# Patient Record
Sex: Male | Born: 1955 | Race: White | Hispanic: No | Marital: Married | Smoking: Never smoker
Health system: Southern US, Community
[De-identification: ages and names within clinical notes are randomized; demographics above are authoritative.]

## PROBLEM LIST (undated history)

## (undated) DIAGNOSIS — M1990 Unspecified osteoarthritis, unspecified site: Secondary | ICD-10-CM

## (undated) DIAGNOSIS — E7800 Pure hypercholesterolemia, unspecified: Secondary | ICD-10-CM

## (undated) DIAGNOSIS — F419 Anxiety disorder, unspecified: Secondary | ICD-10-CM

## (undated) DIAGNOSIS — K219 Gastro-esophageal reflux disease without esophagitis: Secondary | ICD-10-CM

---

## 2008-10-18 ENCOUNTER — Encounter: Admission: RE | Admit: 2008-10-18 | Discharge: 2008-10-18 | Payer: Self-pay | Admitting: Family Medicine

## 2008-10-18 IMAGING — US US EXTREM LOW VENOUS*L*
1 series · 14 of 24 positions shown · non-contrast
Comparison: None

CLINICAL DATA: Pain



[Series 1: us extrem low venous*left* · 14 of 31 slices shown]
[im 1/31]
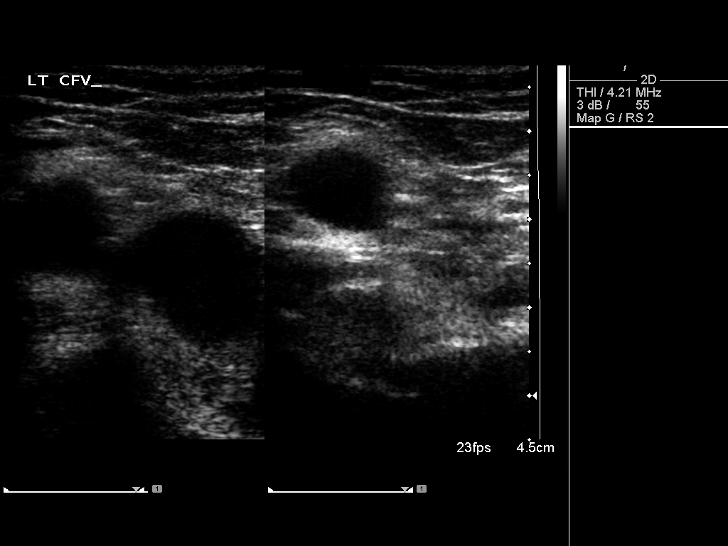
[im 3/31]
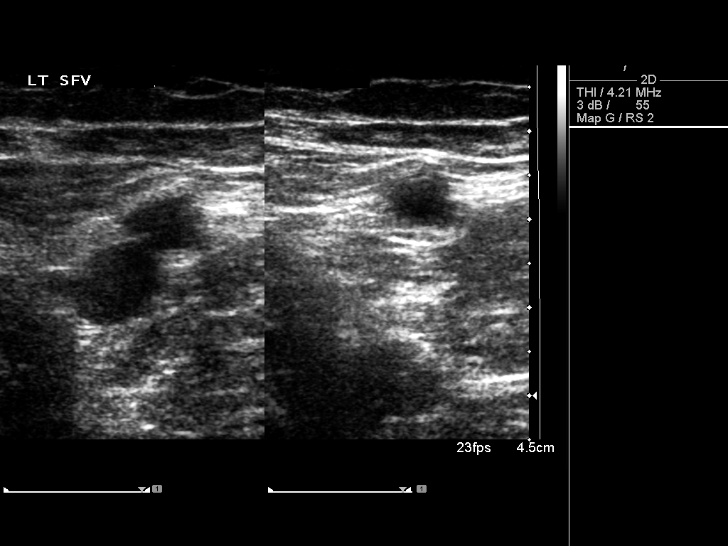
[im 6/31]
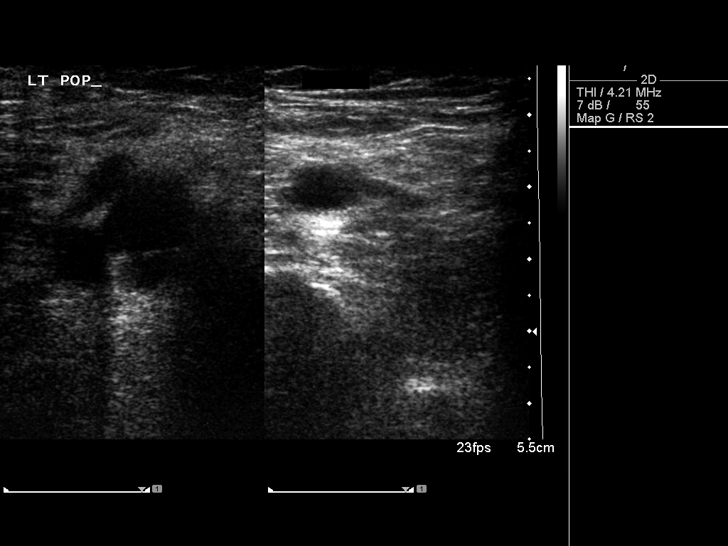
[im 8/31]
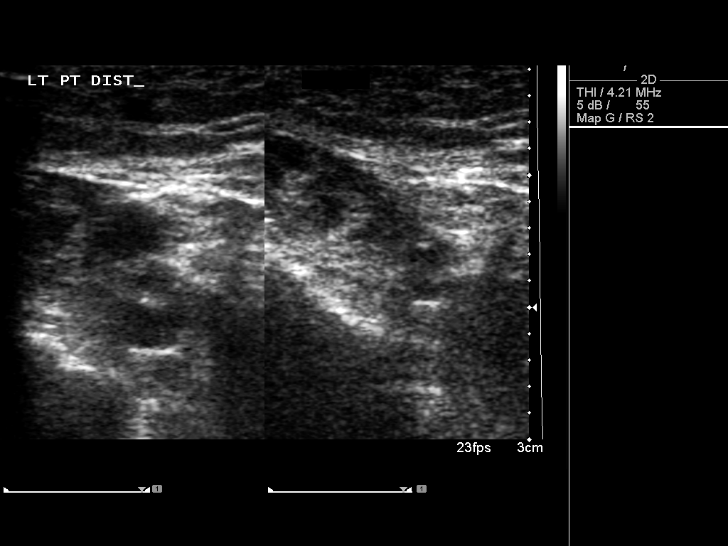
[im 10/31]
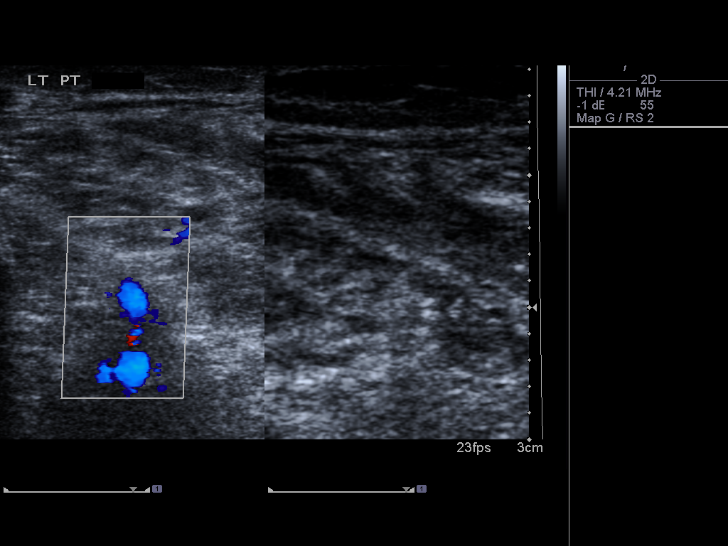
[im 12/31]
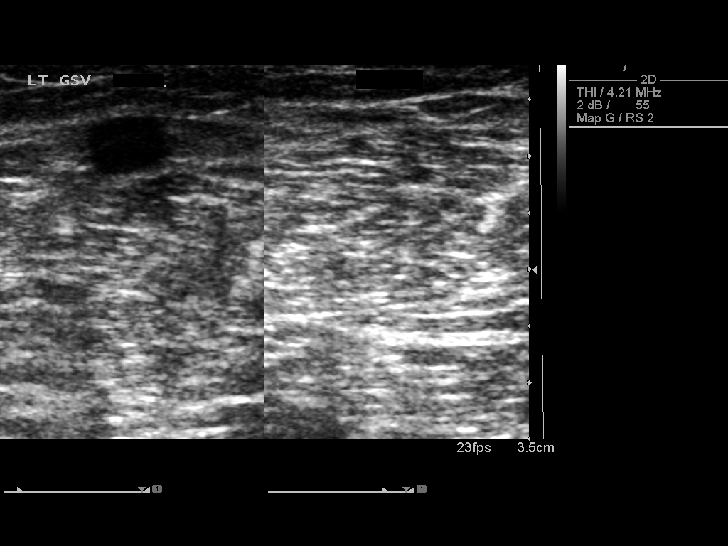
[im 15/31]
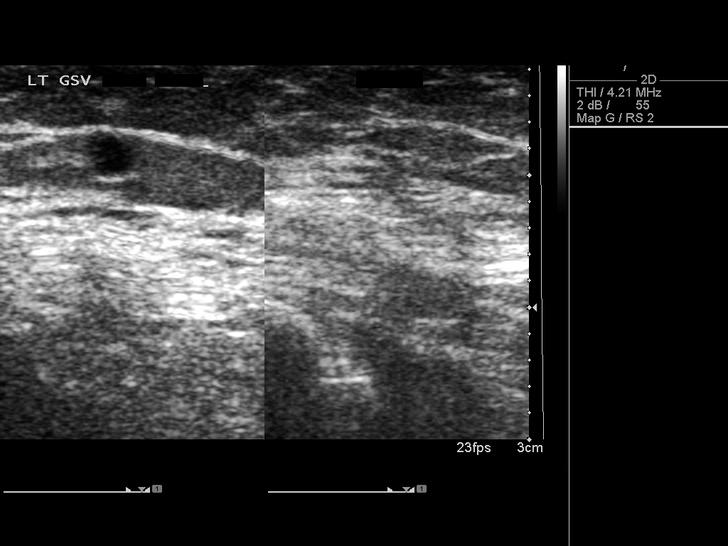
[im 16/31]
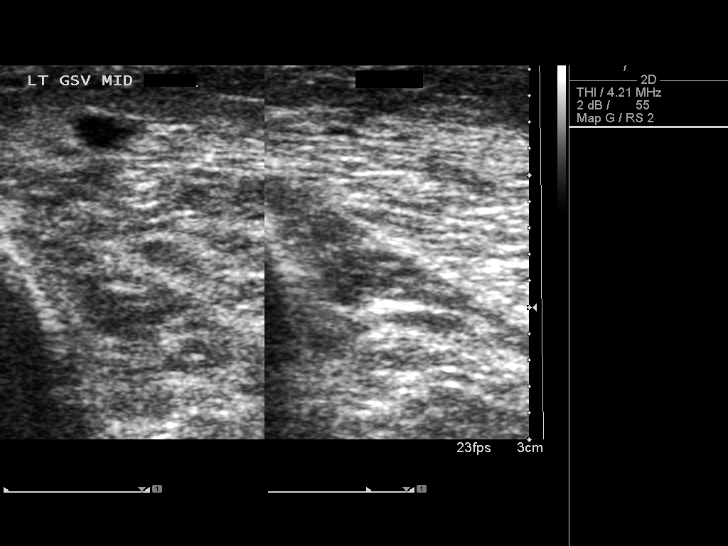
[im 19/31]
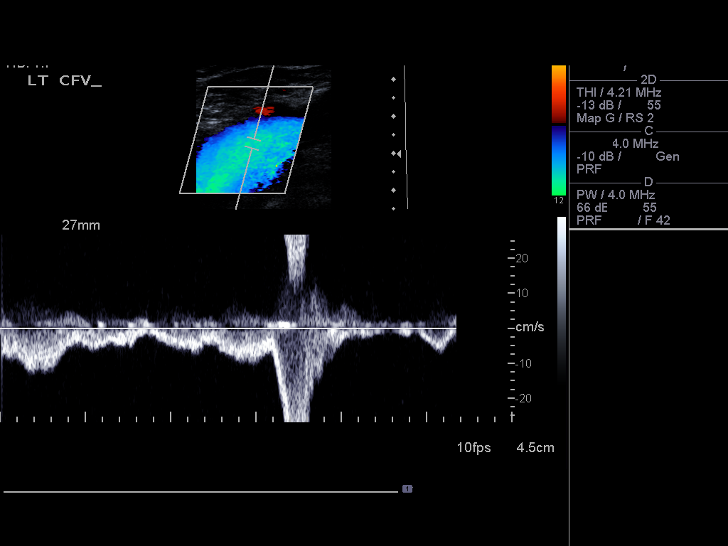
[im 21/31]
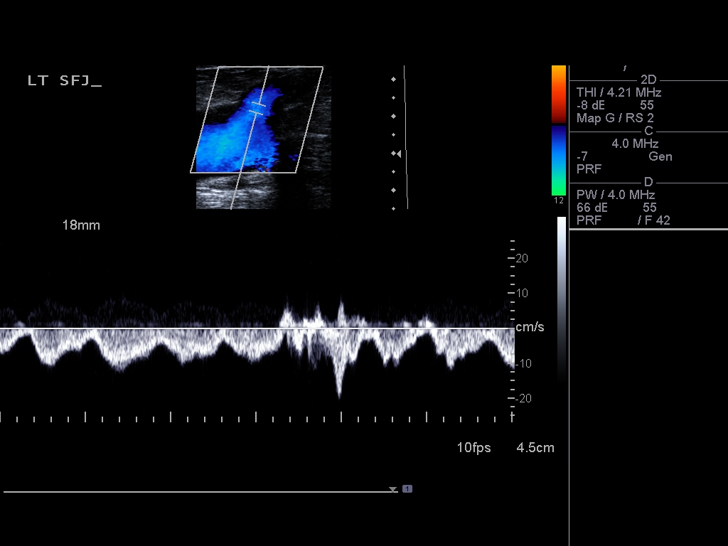
[im 24/31]
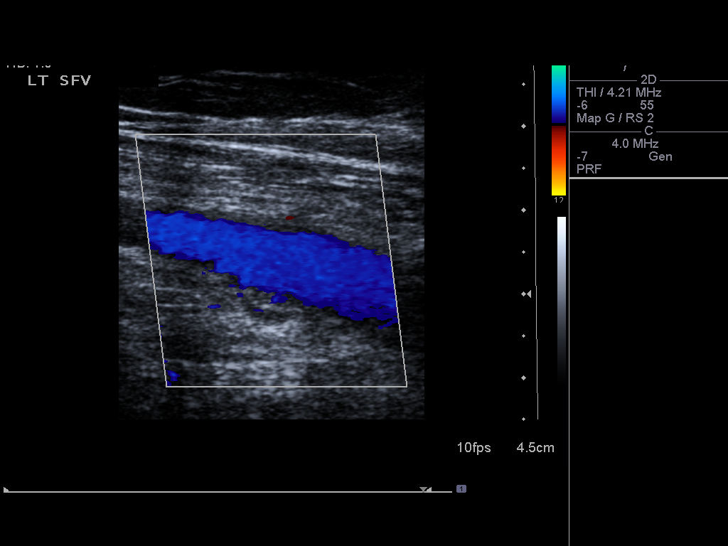
[im 25/31]
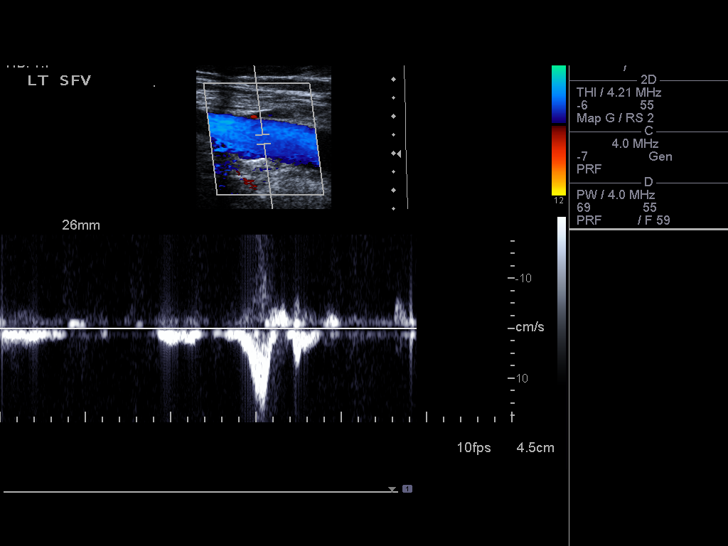
[im 28/31]
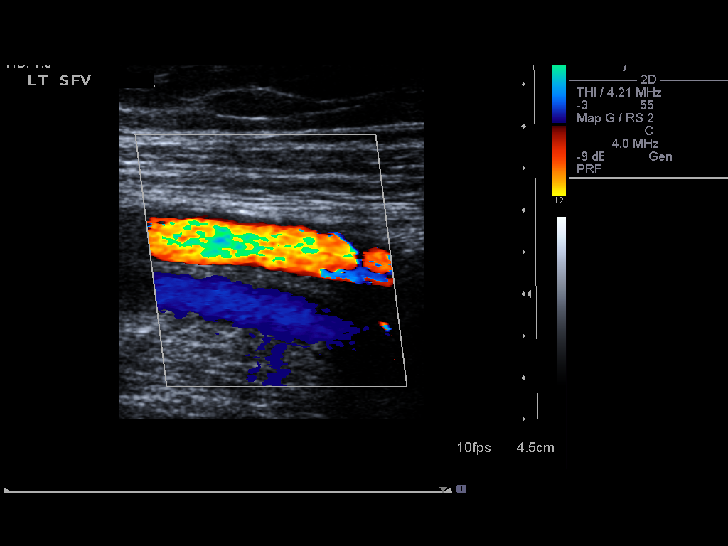
[im 31/31]
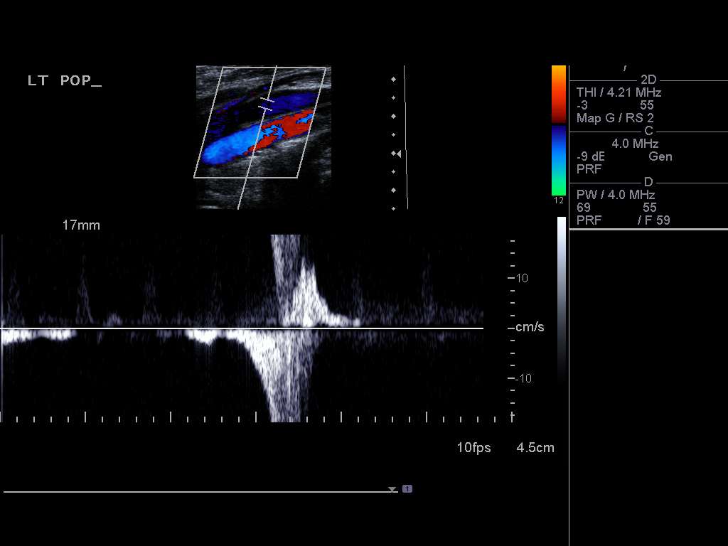

[14 of 24 positions shown; findings below may reference images not displayed]

FINDINGS: Please compressibility of the left common femoral,
femoral, and popliteal veins.  Doppler analysis demonstrates
respiratory phasicity and augmentation of flow upon calf
compression.
IMPRESSION: No evidence of DVT in the left lower extremity

## 2015-04-25 DIAGNOSIS — E7800 Pure hypercholesterolemia, unspecified: Secondary | ICD-10-CM | POA: Diagnosis present

## 2015-04-25 DIAGNOSIS — I8390 Asymptomatic varicose veins of unspecified lower extremity: Secondary | ICD-10-CM | POA: Insufficient documentation

## 2015-04-25 DIAGNOSIS — F411 Generalized anxiety disorder: Secondary | ICD-10-CM | POA: Diagnosis present

## 2015-04-26 DIAGNOSIS — M7712 Lateral epicondylitis, left elbow: Secondary | ICD-10-CM | POA: Insufficient documentation

## 2015-04-26 DIAGNOSIS — M1711 Unilateral primary osteoarthritis, right knee: Secondary | ICD-10-CM | POA: Insufficient documentation

## 2019-04-19 ENCOUNTER — Ambulatory Visit: Payer: Self-pay

## 2019-12-02 DIAGNOSIS — S46219A Strain of muscle, fascia and tendon of other parts of biceps, unspecified arm, initial encounter: Secondary | ICD-10-CM | POA: Insufficient documentation

## 2021-04-25 ENCOUNTER — Encounter (HOSPITAL_COMMUNITY): Payer: Self-pay | Admitting: *Deleted

## 2021-04-25 ENCOUNTER — Emergency Department (HOSPITAL_COMMUNITY)
Admission: EM | Admit: 2021-04-25 | Discharge: 2021-04-25 | Disposition: A | Discharge status: Home / Self Care | Payer: Medicare Other | Attending: Emergency Medicine | Admitting: Emergency Medicine

## 2021-04-25 ENCOUNTER — Emergency Department (HOSPITAL_COMMUNITY): Payer: Medicare Other

## 2021-04-25 ENCOUNTER — Other Ambulatory Visit: Payer: Self-pay

## 2021-04-25 DIAGNOSIS — Z7982 Long term (current) use of aspirin: Secondary | ICD-10-CM | POA: Diagnosis not present

## 2021-04-25 DIAGNOSIS — K862 Cyst of pancreas: Secondary | ICD-10-CM

## 2021-04-25 DIAGNOSIS — R42 Dizziness and giddiness: Secondary | ICD-10-CM

## 2021-04-25 DIAGNOSIS — Z79890 Hormone replacement therapy (postmenopausal): Secondary | ICD-10-CM | POA: Diagnosis not present

## 2021-04-25 DIAGNOSIS — R519 Headache, unspecified: Secondary | ICD-10-CM | POA: Diagnosis not present

## 2021-04-25 DIAGNOSIS — K869 Disease of pancreas, unspecified: Secondary | ICD-10-CM | POA: Insufficient documentation

## 2021-04-25 DIAGNOSIS — E785 Hyperlipidemia, unspecified: Secondary | ICD-10-CM | POA: Insufficient documentation

## 2021-04-25 DIAGNOSIS — Z20822 Contact with and (suspected) exposure to covid-19: Secondary | ICD-10-CM | POA: Insufficient documentation

## 2021-04-25 DIAGNOSIS — R079 Chest pain, unspecified: Secondary | ICD-10-CM | POA: Diagnosis not present

## 2021-04-25 DIAGNOSIS — R1013 Epigastric pain: Secondary | ICD-10-CM | POA: Diagnosis not present

## 2021-04-25 DIAGNOSIS — I69319 Unspecified symptoms and signs involving cognitive functions following cerebral infarction: Secondary | ICD-10-CM | POA: Insufficient documentation

## 2021-04-25 DIAGNOSIS — Z79899 Other long term (current) drug therapy: Secondary | ICD-10-CM | POA: Insufficient documentation

## 2021-04-25 HISTORY — DX: Pure hypercholesterolemia, unspecified: E78.00

## 2021-04-25 HISTORY — DX: Anxiety disorder, unspecified: F41.9

## 2021-04-25 LAB — COMPREHENSIVE METABOLIC PANEL
ALT: 15 U/L (ref 0–44)
AST: 20 U/L (ref 15–41)
Albumin: 3.6 g/dL (ref 3.5–5.0)
Alkaline Phosphatase: 46 U/L (ref 38–126)
Anion gap: 9 (ref 5–15)
BUN: 17 mg/dL (ref 8–23)
CO2: 26 mmol/L (ref 22–32)
Calcium: 8.8 mg/dL — ABNORMAL LOW (ref 8.9–10.3)
Chloride: 105 mmol/L (ref 98–111)
Creatinine, Ser: 0.96 mg/dL (ref 0.61–1.24)
GFR, Estimated: >60 mL/min (ref >60)
Glucose, Bld: 119 mg/dL — ABNORMAL HIGH (ref 70–99)
Potassium: 3.9 mmol/L (ref 3.5–5.1)
Sodium: 140 mmol/L (ref 135–145)
Total Bilirubin: 0.7 mg/dL (ref 0.3–1.2)
Total Protein: 6.2 g/dL — ABNORMAL LOW (ref 6.5–8.1)

## 2021-04-25 LAB — URINALYSIS, ROUTINE W REFLEX MICROSCOPIC
Bilirubin Urine: NEGATIVE
Glucose, UA: NEGATIVE mg/dL
Hgb urine dipstick: NEGATIVE
Ketones, ur: NEGATIVE mg/dL
Nitrite: NEGATIVE
Protein, ur: NEGATIVE mg/dL
Specific Gravity, Urine: 1.02 (ref 1.005–1.030)
pH: 5 (ref 5.0–8.0)

## 2021-04-25 LAB — PROTIME-INR
INR: 1 (ref 0.8–1.2)
Prothrombin Time: 13.4 seconds (ref 11.4–15.2)

## 2021-04-25 LAB — DIFFERENTIAL
Abs Immature Granulocytes: 0.02 10*3/uL (ref 0.00–0.07)
Basophils Absolute: 0 10*3/uL (ref 0.0–0.1)
Basophils Relative: 1 %
Eosinophils Absolute: 0 10*3/uL (ref 0.0–0.5)
Eosinophils Relative: 1 %
Immature Granulocytes: 0 %
Lymphocytes Relative: 9 %
Lymphs Abs: 0.6 10*3/uL — ABNORMAL LOW (ref 0.7–4.0)
Monocytes Absolute: 0.4 10*3/uL (ref 0.1–1.0)
Monocytes Relative: 7 %
Neutro Abs: 5.2 10*3/uL (ref 1.7–7.7)
Neutrophils Relative %: 82 %

## 2021-04-25 LAB — I-STAT CHEM 8, ED
BUN: 20 mg/dL (ref 8–23)
Calcium, Ion: 1.1 mmol/L — ABNORMAL LOW (ref 1.15–1.40)
Chloride: 105 mmol/L (ref 98–111)
Creatinine, Ser: 0.9 mg/dL (ref 0.61–1.24)
Glucose, Bld: 117 mg/dL — ABNORMAL HIGH (ref 70–99)
HCT: 48 % (ref 39.0–52.0)
Hemoglobin: 16.3 g/dL (ref 13.0–17.0)
Potassium: 3.9 mmol/L (ref 3.5–5.1)
Sodium: 141 mmol/L (ref 135–145)
TCO2: 24 mmol/L (ref 22–32)

## 2021-04-25 LAB — APTT: aPTT: 28 seconds (ref 24–36)

## 2021-04-25 LAB — CBC
HCT: 47.7 % (ref 39.0–52.0)
Hemoglobin: 15.8 g/dL (ref 13.0–17.0)
MCH: 30.2 pg (ref 26.0–34.0)
MCHC: 33.1 g/dL (ref 30.0–36.0)
MCV: 91.2 fL (ref 80.0–100.0)
Platelets: 257 10*3/uL (ref 150–400)
RBC: 5.23 MIL/uL (ref 4.22–5.81)
RDW: 13 % (ref 11.5–15.5)
WBC: 6.2 10*3/uL (ref 4.0–10.5)
nRBC: 0 % (ref 0.0–0.2)

## 2021-04-25 LAB — CBG MONITORING, ED
Glucose-Capillary: 104 mg/dL — ABNORMAL HIGH (ref 70–99)
Glucose-Capillary: 109 mg/dL — ABNORMAL HIGH (ref 70–99)

## 2021-04-25 LAB — RESP PANEL BY RT-PCR (FLU A&B, COVID) ARPGX2
Influenza A by PCR: NEGATIVE
Influenza B by PCR: NEGATIVE
SARS Coronavirus 2 by RT PCR: NEGATIVE

## 2021-04-25 LAB — RAPID URINE DRUG SCREEN, HOSP PERFORMED
Amphetamines: NOT DETECTED
Barbiturates: NOT DETECTED
Benzodiazepines: NOT DETECTED
Cocaine: NOT DETECTED
Opiates: NOT DETECTED
Tetrahydrocannabinol: POSITIVE — AB

## 2021-04-25 LAB — TROPONIN I (HIGH SENSITIVITY)
Troponin I (High Sensitivity): 6 ng/L (ref <18)
Troponin I (High Sensitivity): 5 ng/L (ref <18)

## 2021-04-25 LAB — ETHANOL: Alcohol, Ethyl (B): <10 mg/dL (ref <10)

## 2021-04-25 IMAGING — CT CT ANGIO CHEST-ABD-PELV FOR DISSECTION W/ AND WO/W CM
2 of 6 series · 12 of 36 positions shown, 17 images · non-contrast
Comparison: None.

CLINICAL DATA: Chest pain or back pain, aortic dissection suspected

EXAM:
CT ANGIOGRAPHY CHEST, ABDOMEN AND PELVIS
TECHNIQUE: Non-contrast CT of the chest was initially obtained.

[Series 5: arterial · axial · arterial · 0.68mm/px · z∈[-810,-290]mm · 11 of 298 slices shown, 15 images]
[im 19/298  mediastinal]
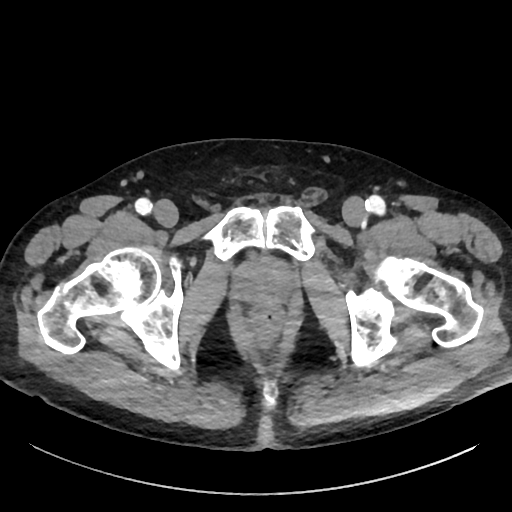
[im 19/298  bone]
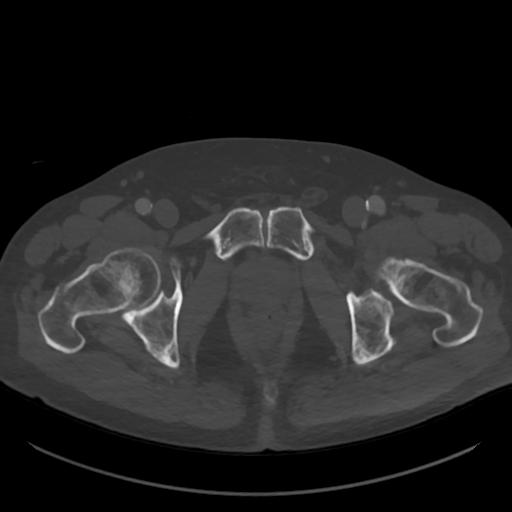
[im 56/298  mediastinal]
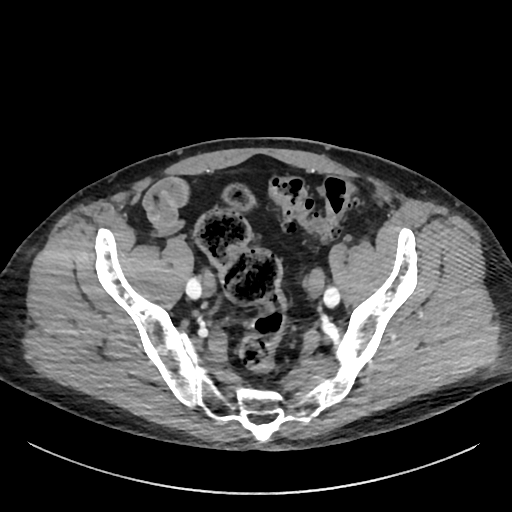
[im 93/298  mediastinal]
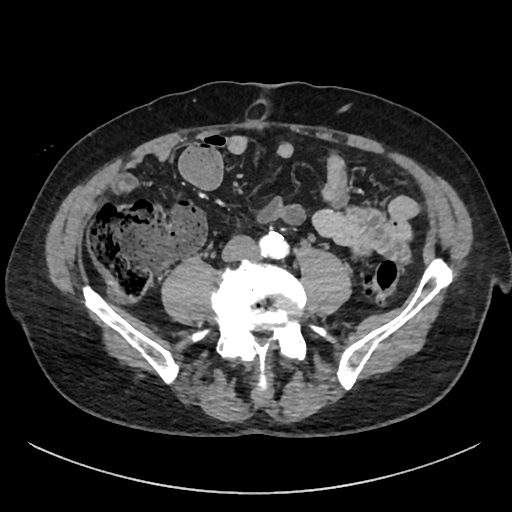
[im 112/298  mediastinal]
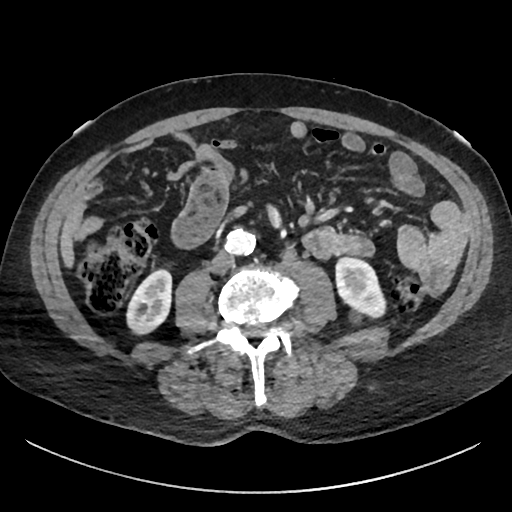
[im 149/298  mediastinal]
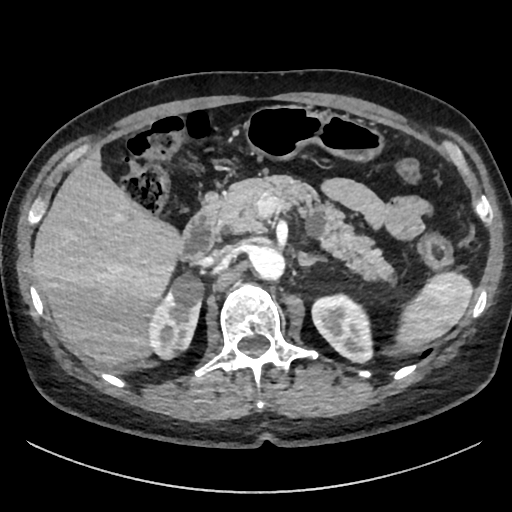
[im 186/298  mediastinal]
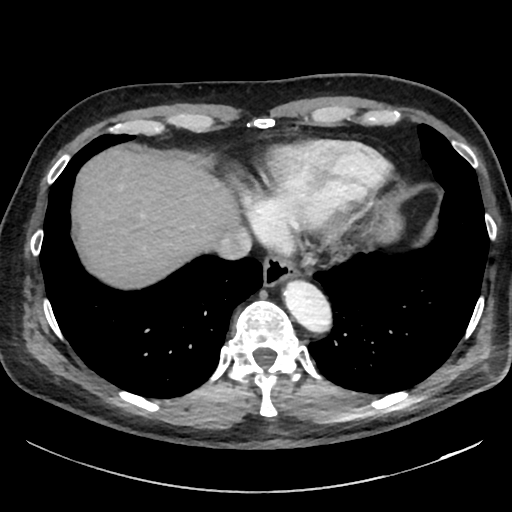
[im 205/298  mediastinal]
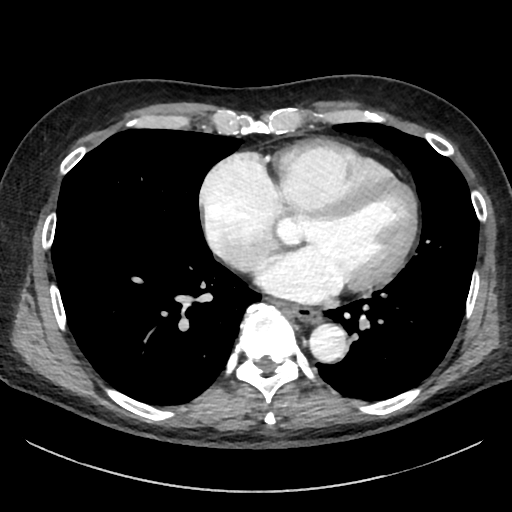
[im 223/298  lung]
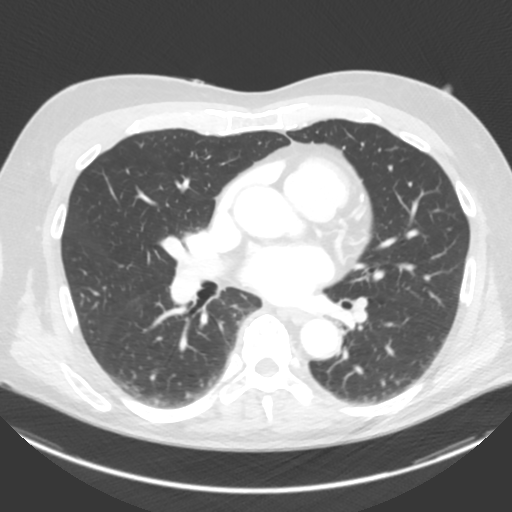
[im 242/298  mediastinal]
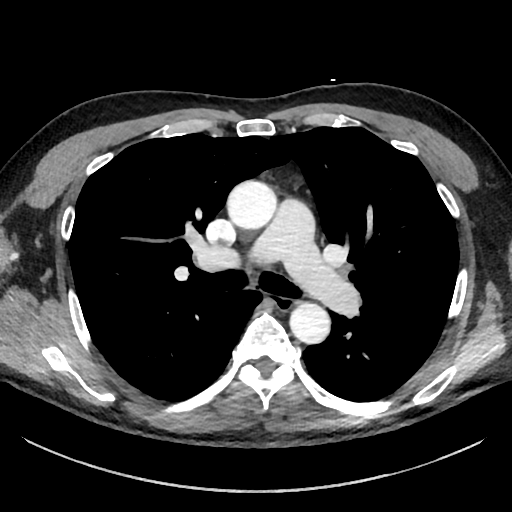
[im 242/298  lung]
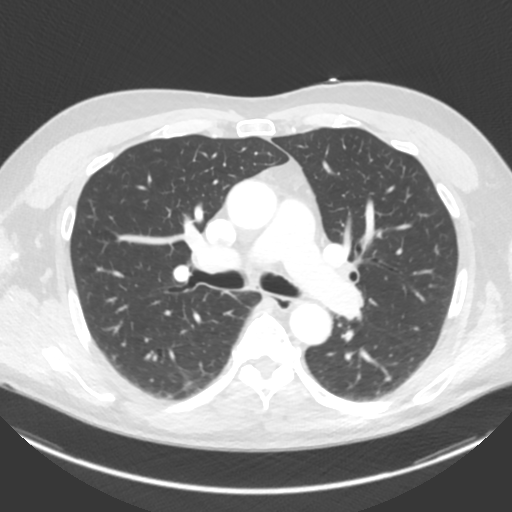
[im 260/298  lung]
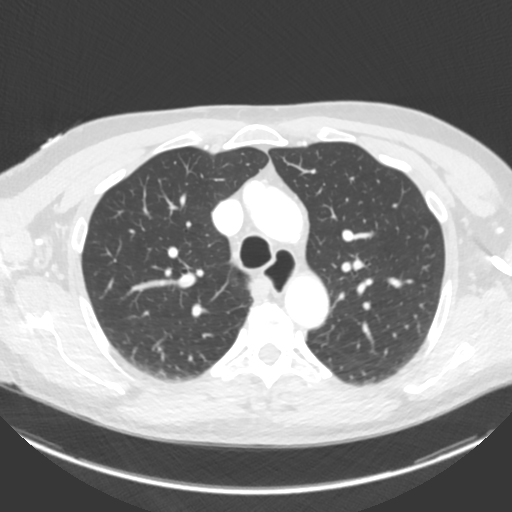
[im 279/298  mediastinal]
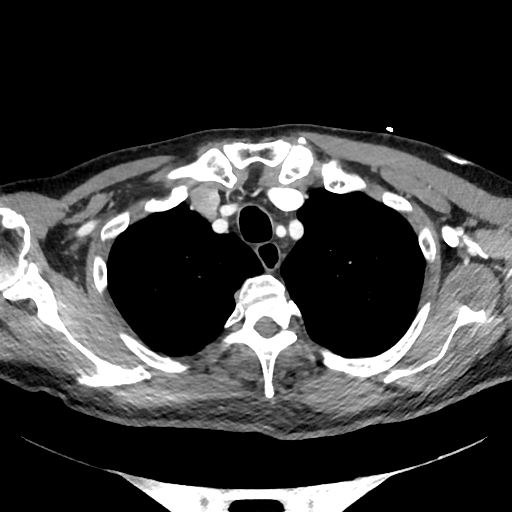
[im 279/298  lung]
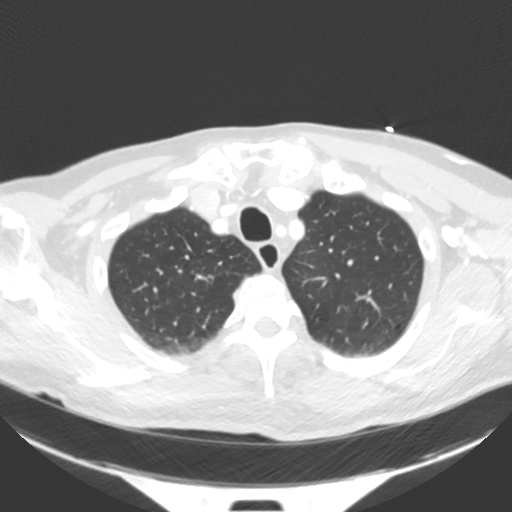
[im 279/298  bone]
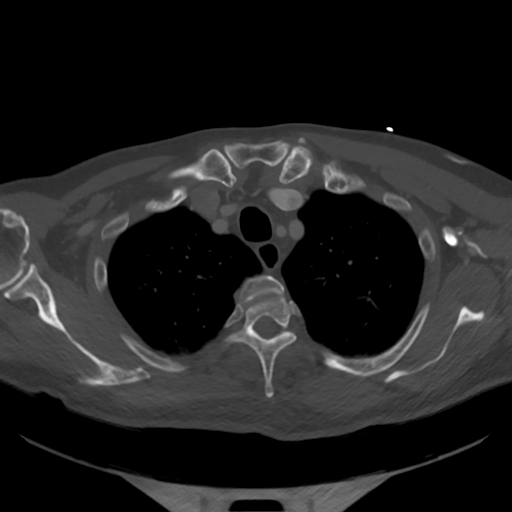

[Series 8: cor · coronal · 0.87mm/px · 1 of 150 slices shown, 2 images]
[im 75/150  mediastinal]
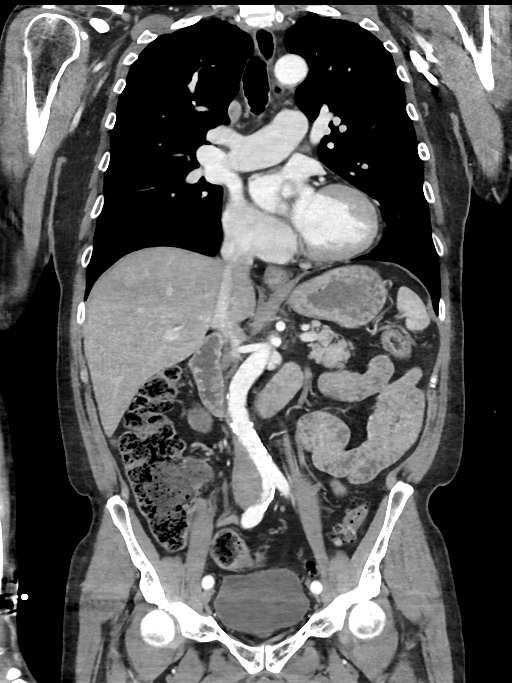
[im 75/150  bone]
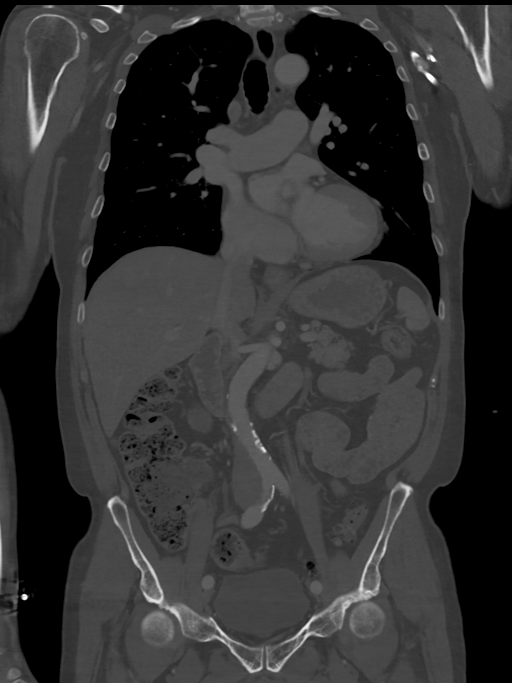

[12 of 36 positions shown; findings below may reference images not displayed]

Multidetector CT imaging through the chest, abdomen and pelvis was
performed using the standard protocol during bolus administration of
intravenous contrast. Multiplanar reconstructed images and MIPs were
obtained and reviewed to evaluate the vascular anatomy.

RADIATION DOSE REDUCTION: This exam was performed according to the
departmental dose-optimization program which includes automated
exposure control, adjustment of the mA and/or kV according to
patient size and/or use of iterative reconstruction technique.

CONTRAST:  100mL OMNIPAQUE IOHEXOL 350 MG/ML SOLN
FINDINGS: CTA CHEST FINDINGS

Cardiovascular: Thoracic aorta is normal in caliber with minor
calcified plaque. No evidence of dissection. Heart size is normal.
No pericardial effusion. Coronary artery calcification.

Mediastinum/Nodes: Included thyroid is unremarkable. No enlarged
nodes.

Lungs/Pleura: Mild atelectasis/scarring at the lung bases. No
pleural effusion or pneumothorax.

Musculoskeletal: Degenerative changes of the thoracic spine.

Review of the MIP images confirms the above findings.

CTA ABDOMEN AND PELVIS FINDINGS

VASCULAR

Aorta: Normal caliber. Primarily calcified plaque is present. No
evidence dissection.

Celiac: Patent.

SMA: Patent.

Renals: Patent.

IMA: Patent.

Inflow: Patent.  Calcified plaque.

Veins: Poorly evaluated

Review of the MIP images confirms the above findings.

NON-VASCULAR

Hepatobiliary: No focal liver abnormality is seen. No gallstones,
gallbladder wall thickening, or biliary dilatation.

Pancreas: 2.9 cm exophytic low-density lesion of the body. No
pancreatic ductal dilatation or surrounding inflammatory changes.

Spleen: Unremarkable.

Adrenals/Urinary Tract: Adrenals are unremarkable. Bilateral renal
cysts. Bladder is unremarkable.

Stomach/Bowel: Stomach is within normal limits. Bowel is normal in
caliber. Normal appendix. Distal colonic diverticulosis.

Lymphatic: No enlarged nodes.

Reproductive: Enlarged prostate.

Other: No free fluid.  No acute abnormality of the abdominal wall.

Musculoskeletal: Degenerative changes of the lumbar spine.

Review of the MIP images confirms the above findings.
IMPRESSION: No evidence of aortic dissection.  Atherosclerosis.

2.9 cm exophytic low-density lesion of the body of the pancreas.
Recommend nonemergent MRI/MRCP preferably as an outpatient when
patient is better able to tolerate the study.

Colonic diverticulosis.

## 2021-04-25 IMAGING — MR MR HEAD W/O CM
12 of 13 series · 44 of 48 positions shown · non-contrast
Comparison: Head CT and CTA [DATE]

CLINICAL DATA: Neuro deficit, acute, stroke suspected.  Dizziness.

EXAM:
MRI HEAD WITHOUT CONTRAST
TECHNIQUE: Multiplanar, multiecho pulse sequences of the brain and surrounding
structures were obtained without intravenous contrast.

[Series 5: DWI · axial · 3.0mm · 0.88mm/px · z∈[-87,+72]mm · 8 of 108 slices shown (1 of 4)]
[im 1/108]
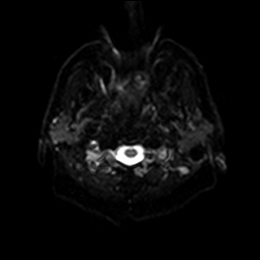
[im 16/108]
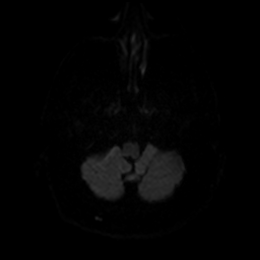
[im 31/108]
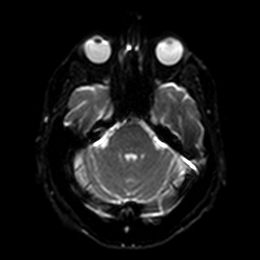
[im 46/108]
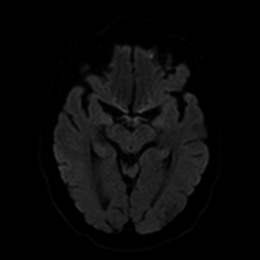
[im 62/108]
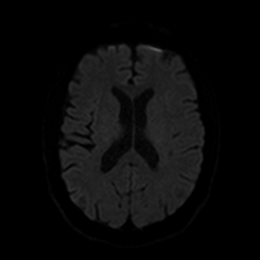
[im 77/108]
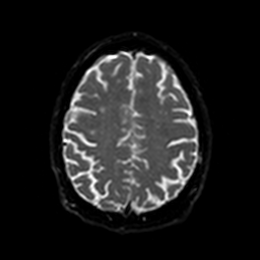
[im 92/108]
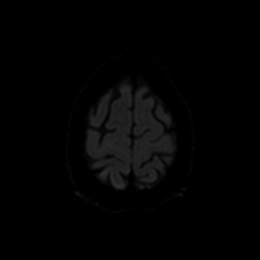
[im 108/108]
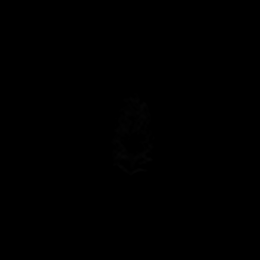

[Series 6: DWI · axial · 3.0mm · 0.88mm/px · z∈[-87,+72]mm · 4 of 54 slices shown (2 of 4)]
[im 1/54]
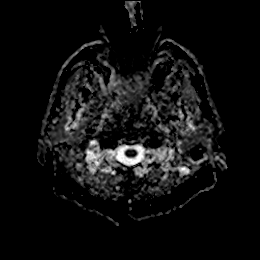
[im 18/54]
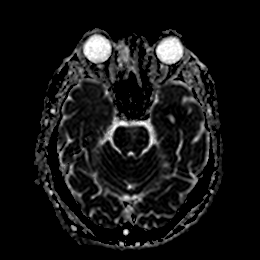
[im 36/54]
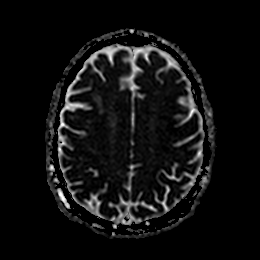
[im 54/54]
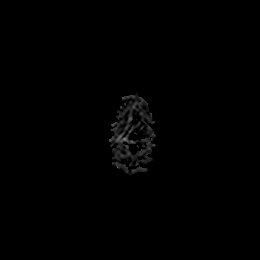

[Series 7: DWI · coronal · 4.0mm · 0.88mm/px · 5 of 76 slices shown (3 of 4)]
[im 1/76]
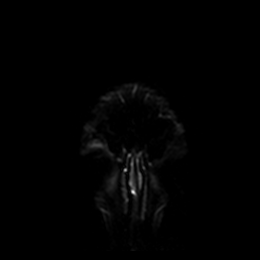
[im 19/76]
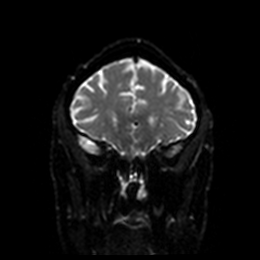
[im 38/76]
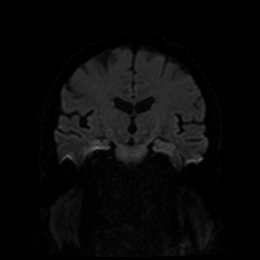
[im 57/76]
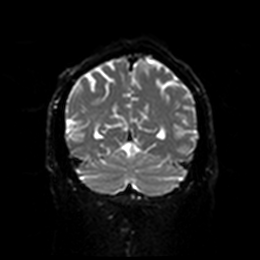
[im 76/76]
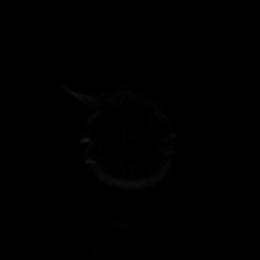

[Series 8: DWI · coronal · 4.0mm · 0.88mm/px · 3 of 38 slices shown (4 of 4)]
[im 1/38]
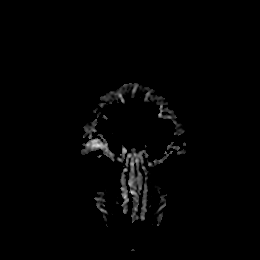
[im 19/38]
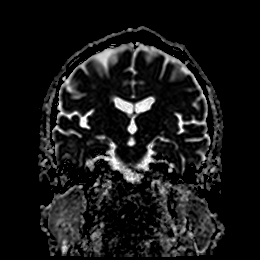
[im 38/38]
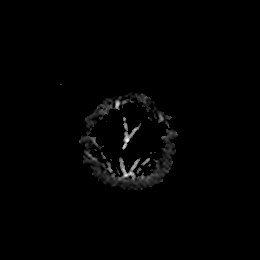

[Series 9: FLAIR · axial · 5.0mm · 0.45mm/px · z∈[-88,+56]mm · 2 of 25 slices shown]
[im 1/25]
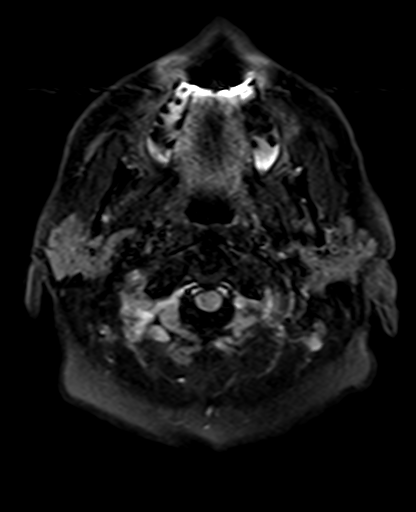
[im 25/25]
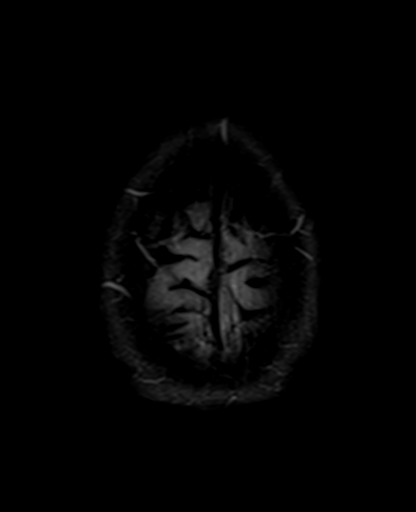

[Series 10: mag_images · axial · 3.0mm · 0.90mm/px · z∈[-91,+85]mm · 4 of 60 slices shown]
[im 1/60]
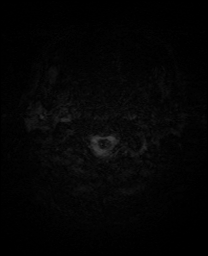
[im 20/60]
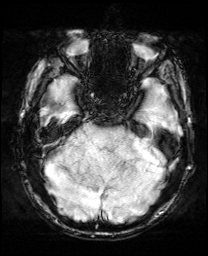
[im 40/60]
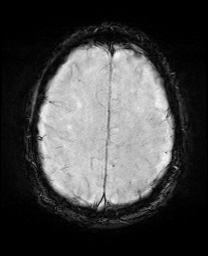
[im 60/60]
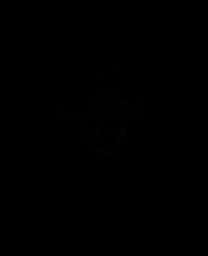

[Series 11: pha_images · axial · 3.0mm · 0.90mm/px · z∈[-91,+82]mm · 4 of 57 slices shown]
[im 1/57]
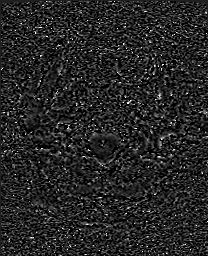
[im 19/57]
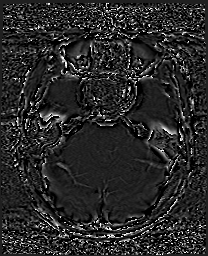
[im 38/57]
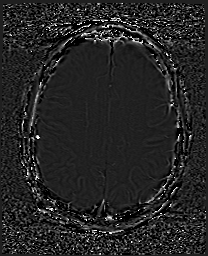
[im 57/57]
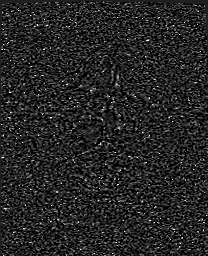

[Series 12: swi_images · axial · 3.0mm · 0.90mm/px · z∈[-91,+85]mm · 4 of 60 slices shown]
[im 1/60]
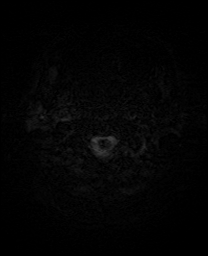
[im 20/60]
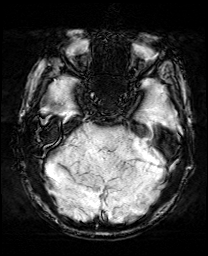
[im 40/60]
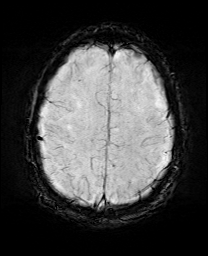
[im 60/60]
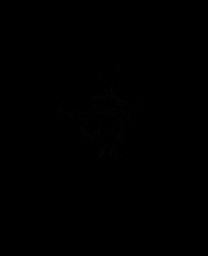

[Series 13: mip_images(sw) · axial · 24.0mm · 0.90mm/px · z∈[-81,+75]mm · 4 of 53 slices shown]
[im 1/53]
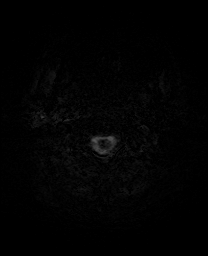
[im 18/53]
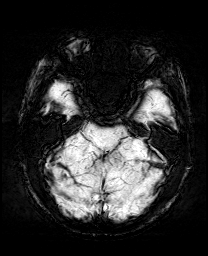
[im 35/53]
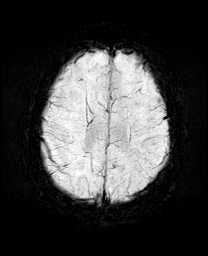
[im 53/53]
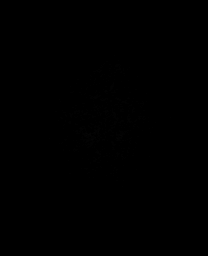

[Series 14: T2 · axial · 5.0mm · 0.72mm/px · z∈[-88,+56]mm · 2 of 25 slices shown (1 of 2)]
[im 1/25]
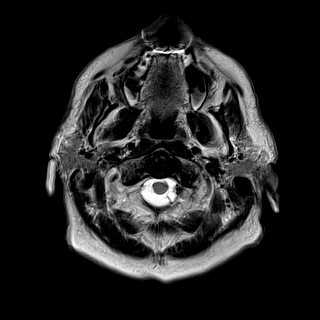
[im 25/25]
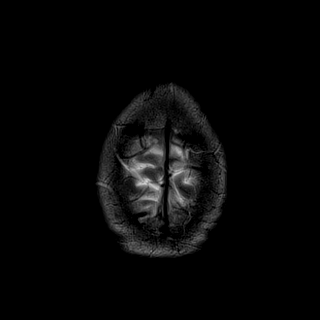

[Series 15: T1 · sagittal · 5.0mm · 0.78mm/px · 2 of 22 slices shown]
[im 1/22]
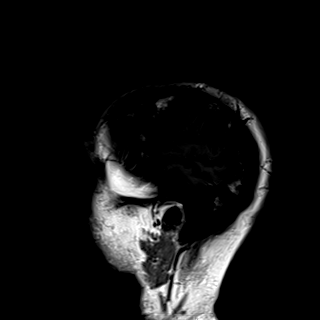
[im 22/22]
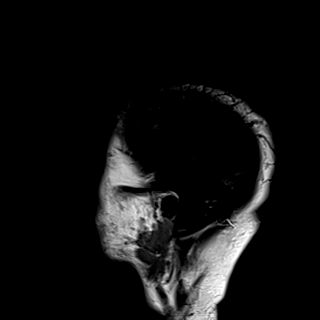

[Series 17: T2 · coronal · 5.0mm · 0.34mm/px · 2 of 29 slices shown (2 of 2)]
[im 1/29]
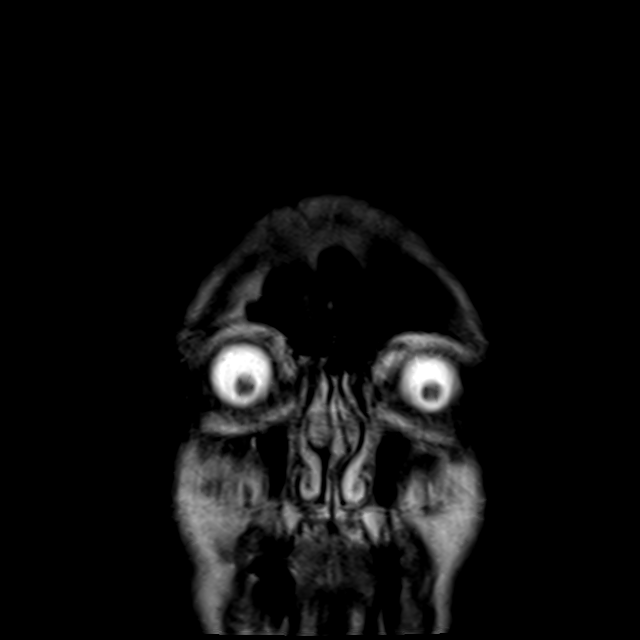
[im 29/29]
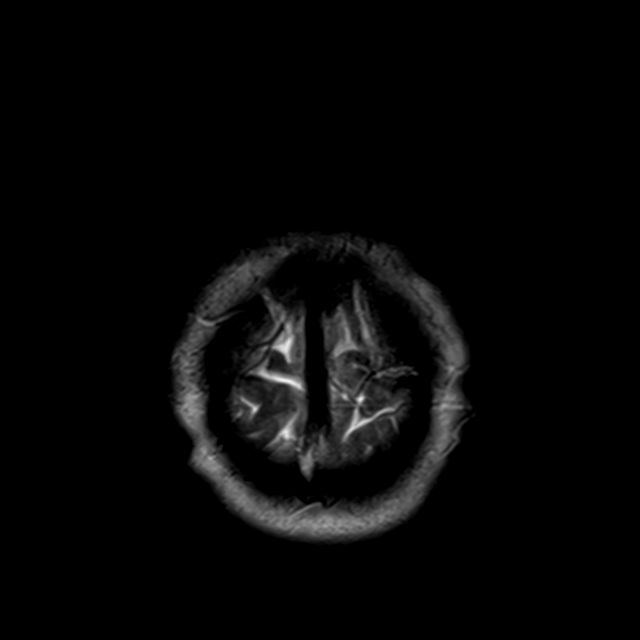

[44 of 48 positions shown; findings below may reference images not displayed]

FINDINGS: Brain: There is no evidence of an acute infarct, intracranial
hemorrhage, mass, midline shift, or extra-axial fluid collection.
The ventricles and sulci are within normal limits for age. Small T2
hyperintensities in the cerebral white matter bilaterally are
nonspecific but compatible with mild chronic small vessel ischemic
disease. No focal cerebellar insult is evident.

Vascular: Major intracranial vascular flow voids are preserved.

Skull and upper cervical spine: Unremarkable bone marrow signal.

Sinuses/Orbits: Unremarkable orbits. Mild mucosal thickening in
right ethmoid air cells. Clear mastoid air cells.

Other: None.
IMPRESSION: 1. No acute intracranial abnormality.
2. Mild chronic small vessel ischemic disease.

## 2021-04-25 IMAGING — CT CT ANGIO HEAD-NECK (W OR W/O PERF)
2 of 7 series · 8 of 33 positions shown · IV contrast (APPLIED)
Comparison: Same-day noncontrast head CT.

CLINICAL DATA: Neuro deficit, stroke suspected

EXAM:
CT ANGIOGRAPHY HEAD AND NECK
TECHNIQUE: Multidetector CT imaging of the head and neck was performed using
the standard protocol during bolus administration of intravenous
contrast. Multiplanar CT image reconstructions and MIPs were
obtained to evaluate the vascular anatomy. Carotid stenosis
measurements (when applicable) are obtained utilizing NASCET
criteria, using the distal internal carotid diameter as the
denominator.

[Series 3: cta neck/head · axial · 0.47mm/px · z∈[-217,-103]mm · 2 of 173 slices shown]
[im 58/173  soft-tissue]
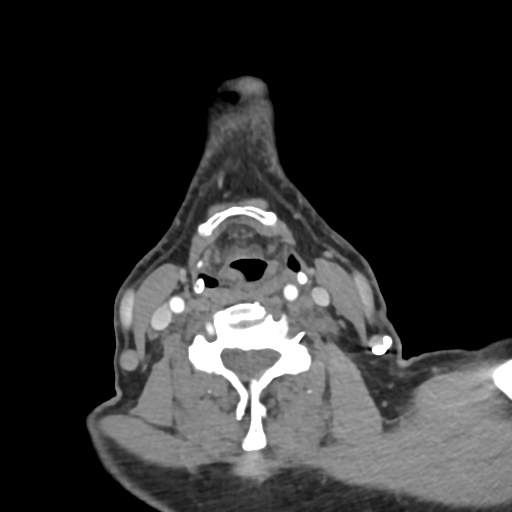
[im 115/173  bone]
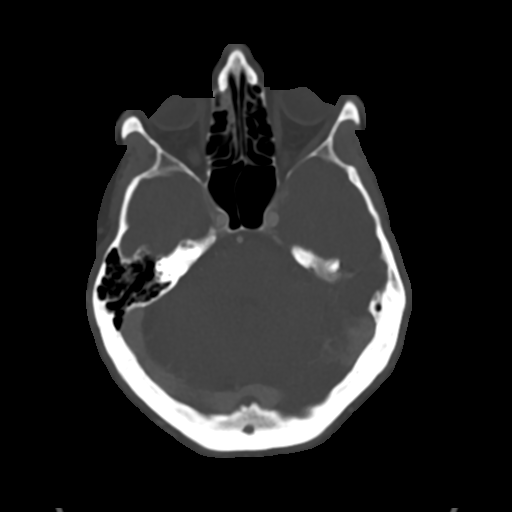

[Series 4: ax thins · axial · 0.39mm/px · z∈[-284,-44]mm · 6 of 336 slices shown]
[im 48/336  soft-tissue]
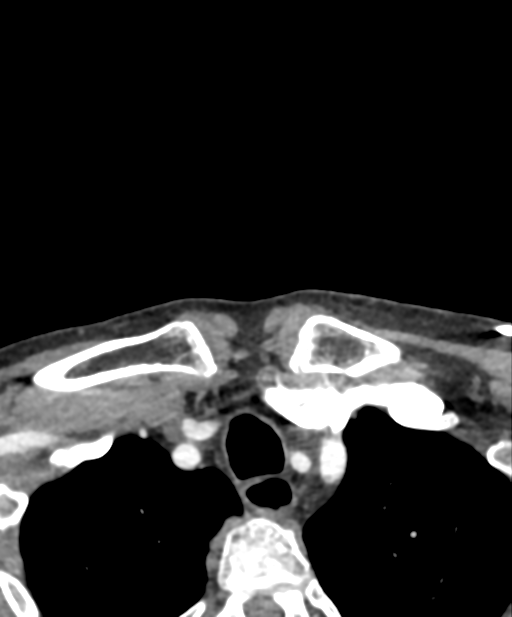
[im 96/336  soft-tissue]
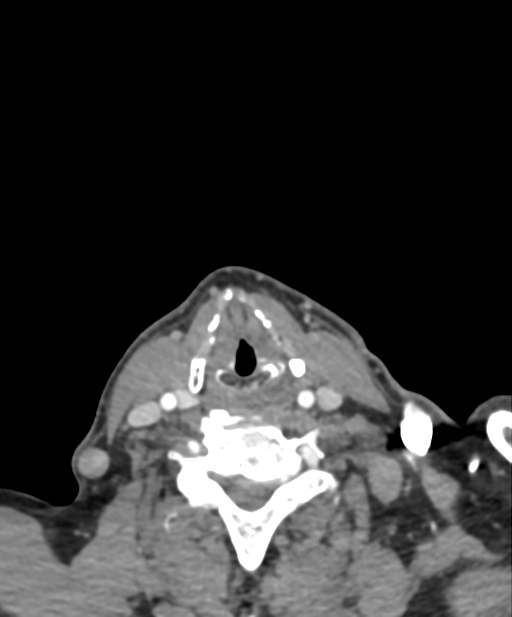
[im 144/336  soft-tissue]
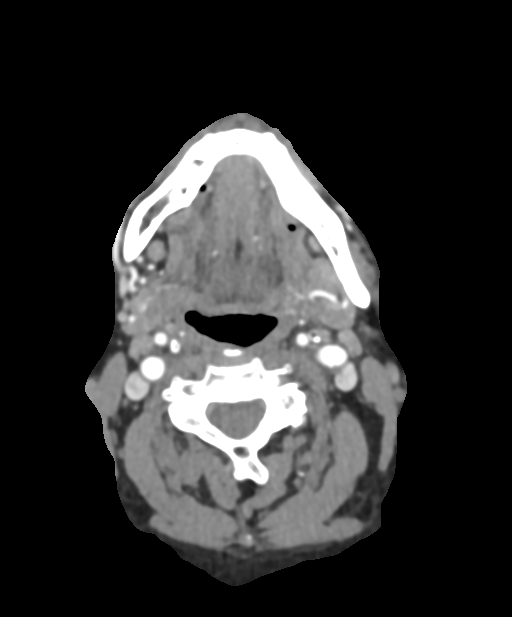
[im 192/336  soft-tissue]
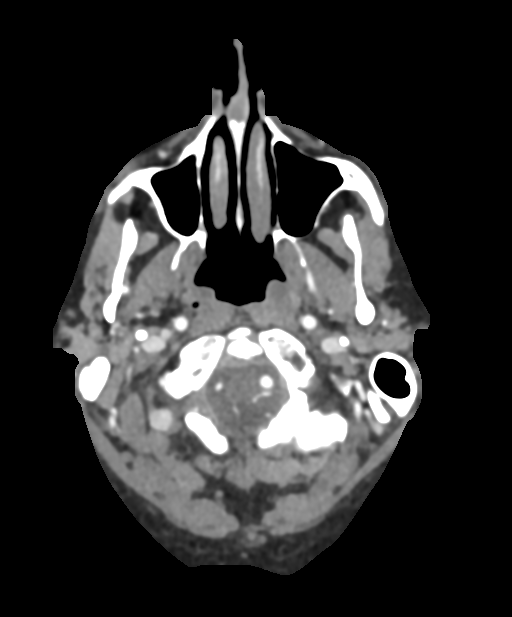
[im 240/336  soft-tissue]
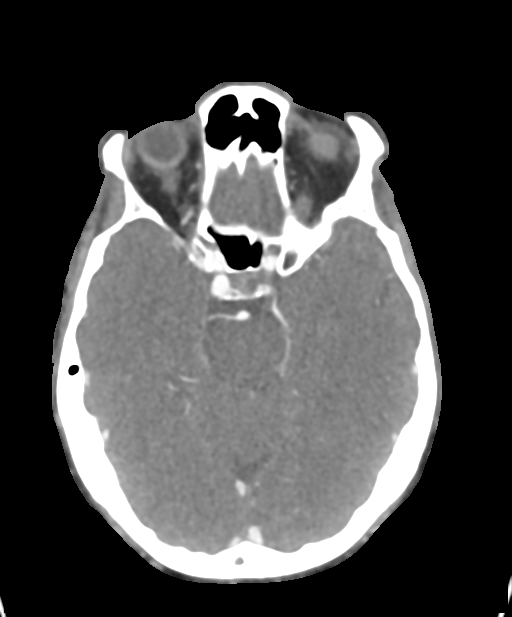
[im 288/336  soft-tissue]
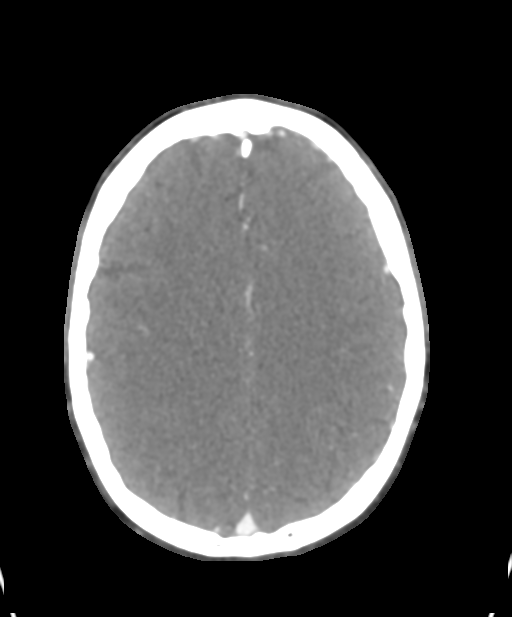

[8 of 33 positions shown; findings below may reference images not displayed]

RADIATION DOSE REDUCTION: This exam was performed according to the
departmental dose-optimization program which includes automated
exposure control, adjustment of the mA and/or kV according to
patient size and/or use of iterative reconstruction technique.

CONTRAST:  100mL OMNIPAQUE IOHEXOL 350 MG/ML SOLN
FINDINGS: CTA NECK FINDINGS

Aortic arch: There is minimal calcified atherosclerotic plaque in
the aortic arch. The origins of the major branch vessels are patent.
The subclavian arteries are patent to the level imaged.

Right carotid system: The right common, internal, and external
carotid arteries are patent, without hemodynamically significant
stenosis or occlusion. There is minimal plaque in the proximal right
ICA. There is no evidence of dissection or aneurysm.

Left carotid system: The left common, internal, and external carotid
arteries are patent, without hemodynamically significant stenosis or
occlusion. There is minimal plaque in the proximal left ICA. There
is no evidence of dissection or aneurysm.

Vertebral arteries: The vertebral arteries are patent, without
hemodynamically significant stenosis or occlusion. There is no
dissection or aneurysm.

Skeleton: There is degenerative change of the cervical spine most
advanced at C5-C6. There is no acute osseous abnormality or
aggressive osseous lesion. There is no visible canal hematoma.

Other neck: The soft tissues are unremarkable.

Upper chest: The imaged lung apices are clear.

Review of the MIP images confirms the above findings

CTA HEAD FINDINGS

Anterior circulation: The intracranial ICAs are patent

Bilateral MCAs are patent.

The bilateral A1 segments are patent. There is an azygous ACA, with
the distal branches arising from a common A2 trunk, a normal
variant. There is no significant stenosis or occlusion.

There is no aneurysm or AVM.

Posterior circulation: The bilateral V4 segments are patent. The
basilar artery is patent.

The bilateral PCAs are patent. There is a fetal origin of the left
PCA. The right posterior communicating artery is also identified.

There is no aneurysm or AVM.

Venous sinuses: Patent.

Anatomic variants: As above.

Review of the MIP images confirms the above findings
IMPRESSION: 1. Patent vasculature of the head and neck.
2. Minimal atherosclerotic plaque at the carotid bifurcations
without hemodynamically significant stenosis.

## 2021-04-25 IMAGING — CT CT HEAD CODE STROKE
4 series · 16 of 47 positions shown, 18 images · non-contrast
Comparison: None.

CLINICAL DATA: Code stroke.



[Series 3: head wo · axial · 0.46mm/px · z∈[-120,+10]mm · 7 of 36 slices shown, 9 images]
[im 5/36  brain]
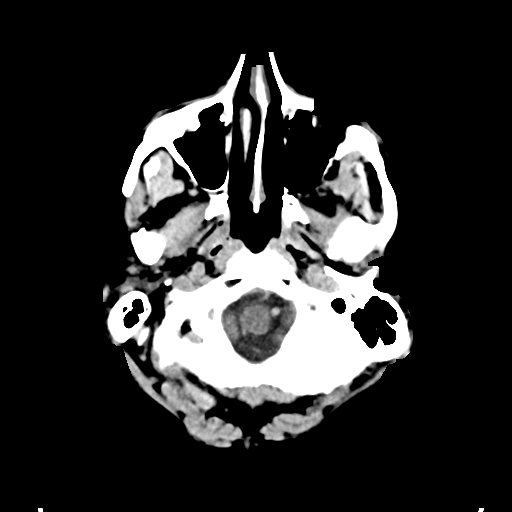
[im 5/36  bone]
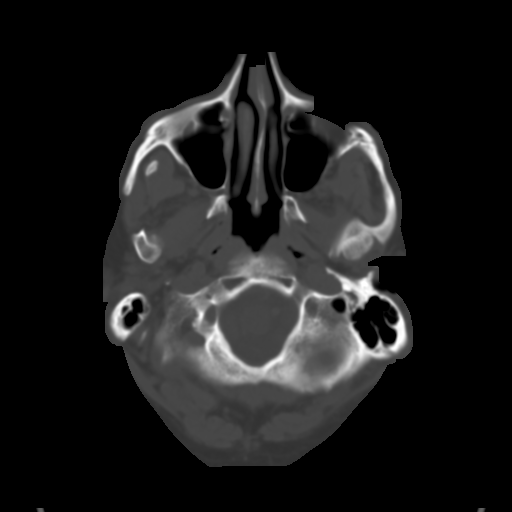
[im 9/36  brain]
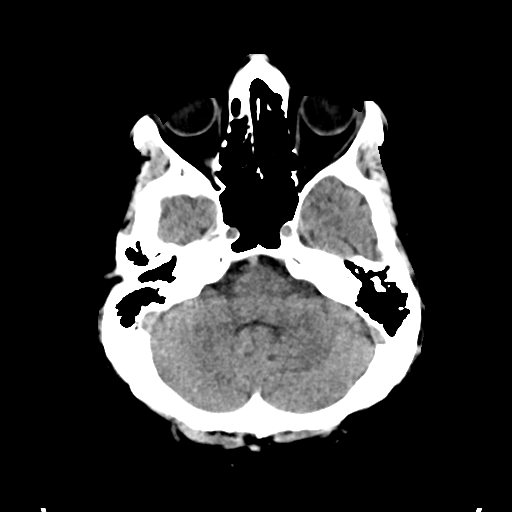
[im 14/36  brain]
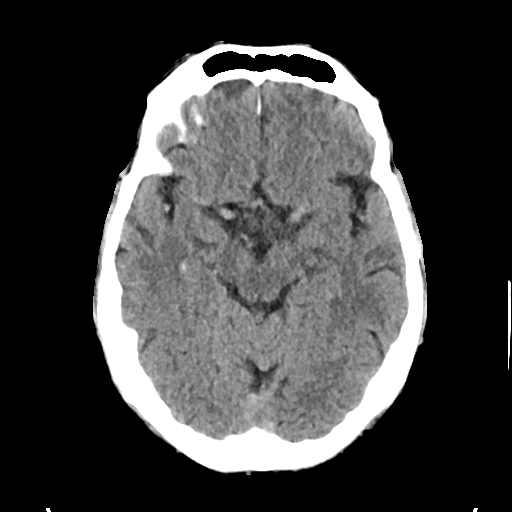
[im 18/36  brain]
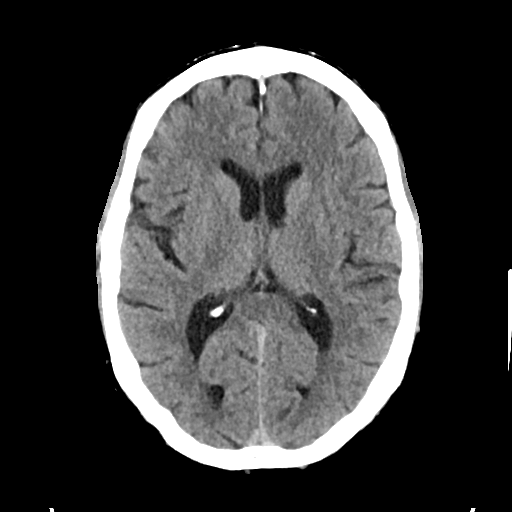
[im 22/36  brain]
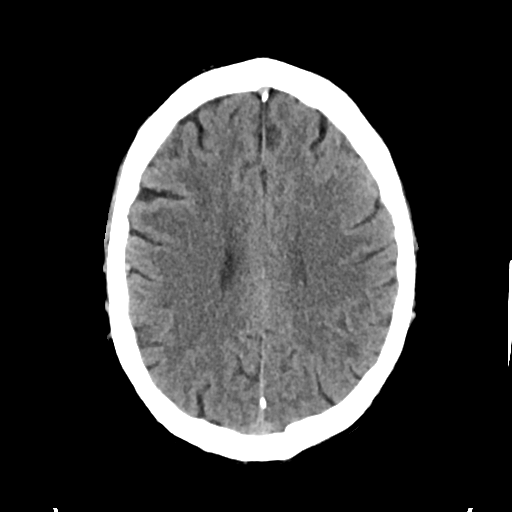
[im 22/36  bone]
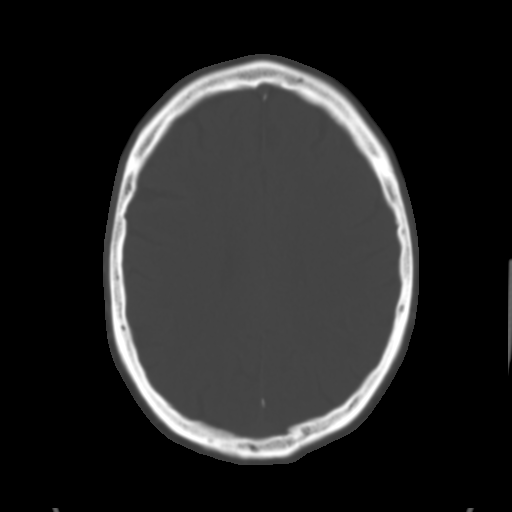
[im 27/36  brain]
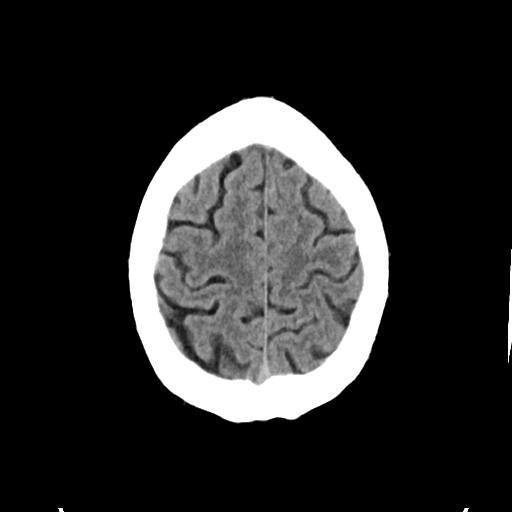
[im 31/36  brain]
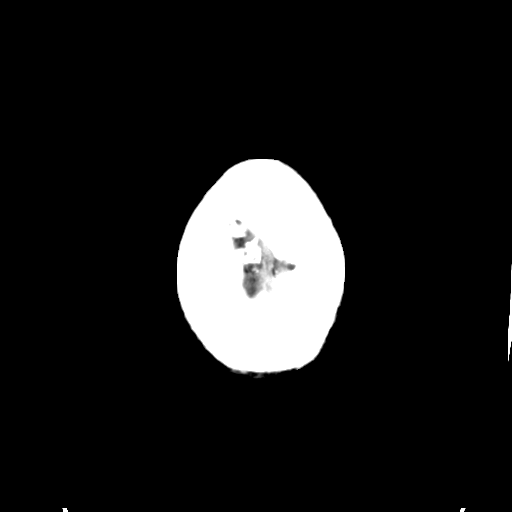

[Series 4: cor soft · coronal · 0.36mm/px · 3 of 79 slices shown]
[im 27/79  brain]
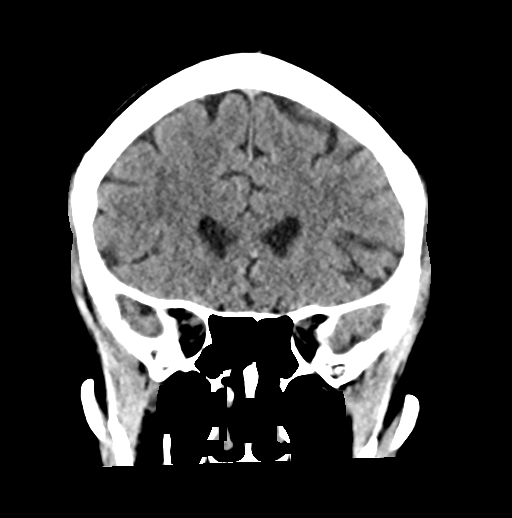
[im 35/79  brain]
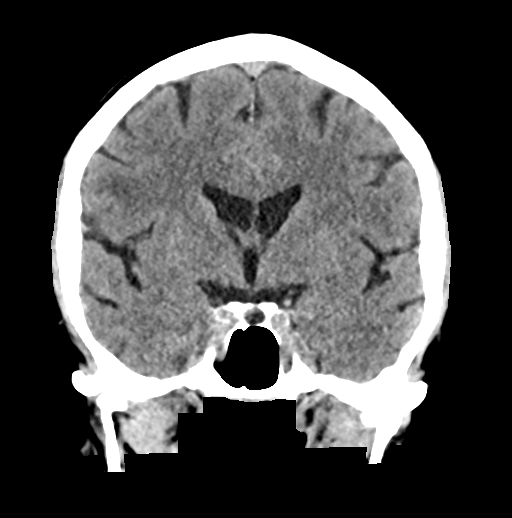
[im 44/79  brain]
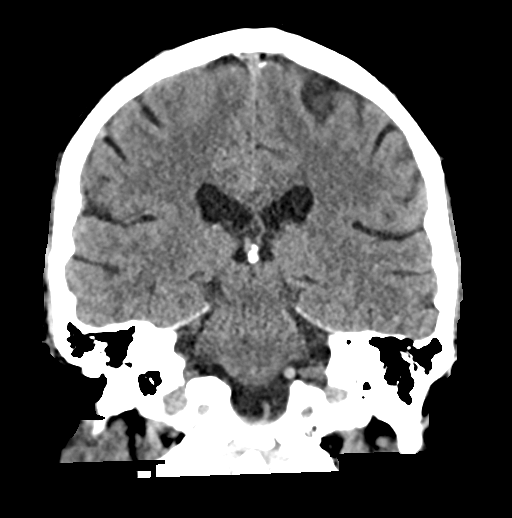

[Series 5: sag soft · sagittal · 0.34mm/px · 3 of 61 slices shown]
[im 21/61  brain]
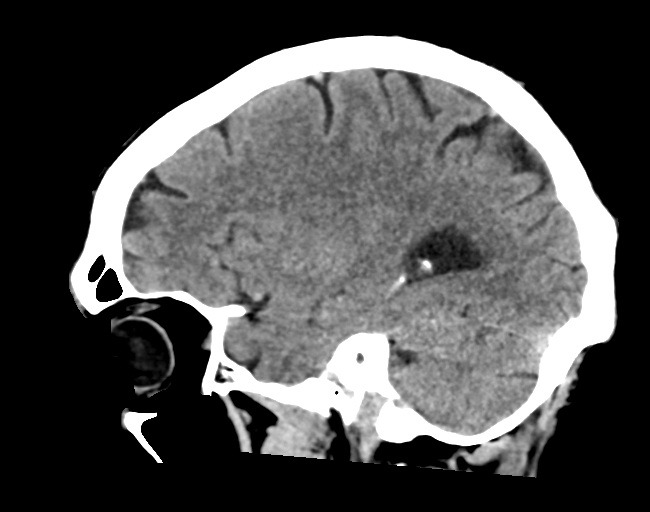
[im 31/61  brain]
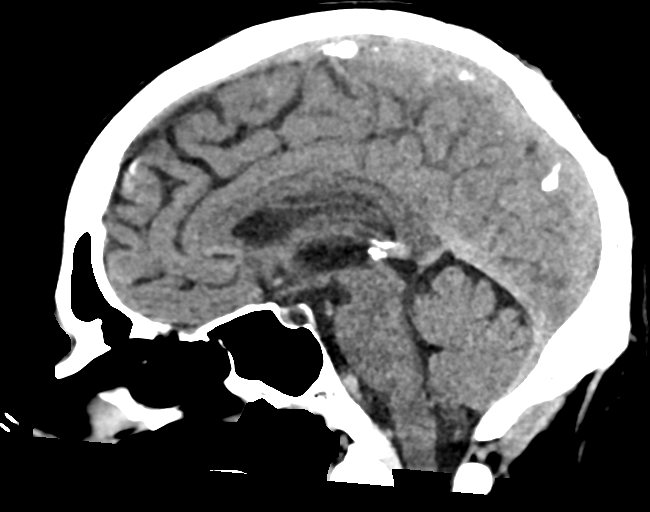
[im 41/61  brain]
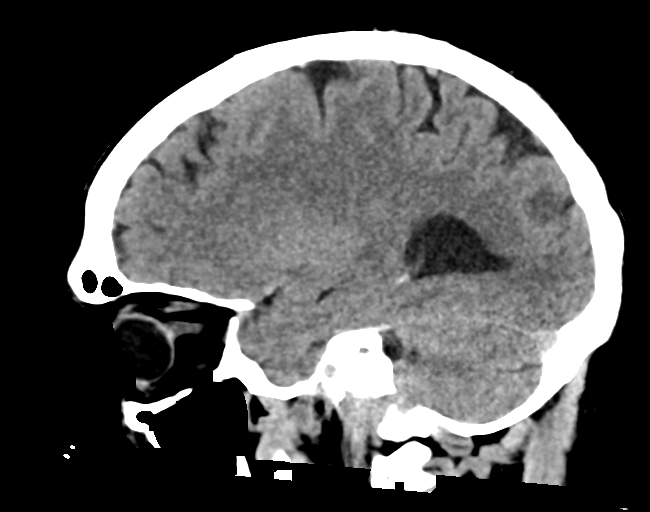

[Series 6: head bone · axial · 0.46mm/px · z∈[-124,-88]mm · 3 of 88 slices shown]
[im 9/88  bone]
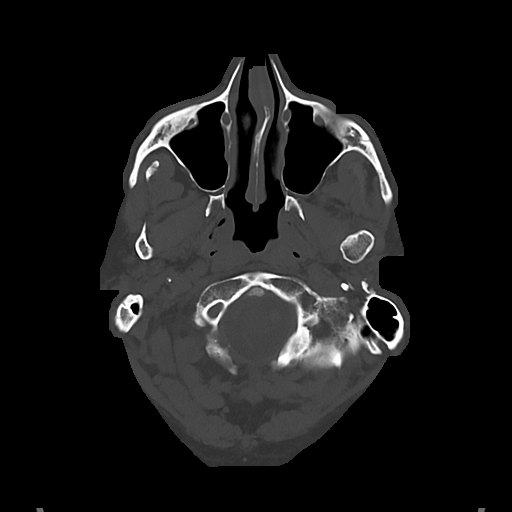
[im 18/88  bone]
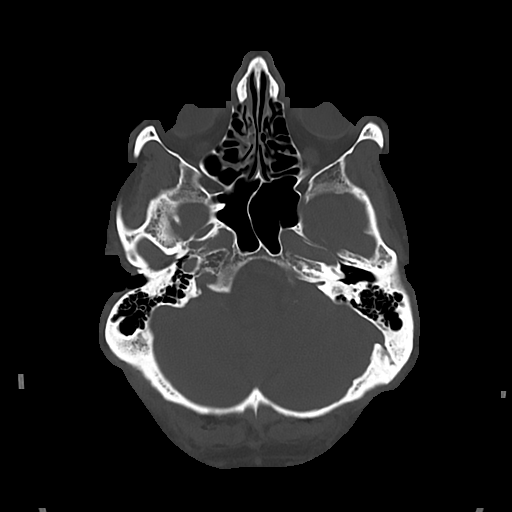
[im 27/88  bone]
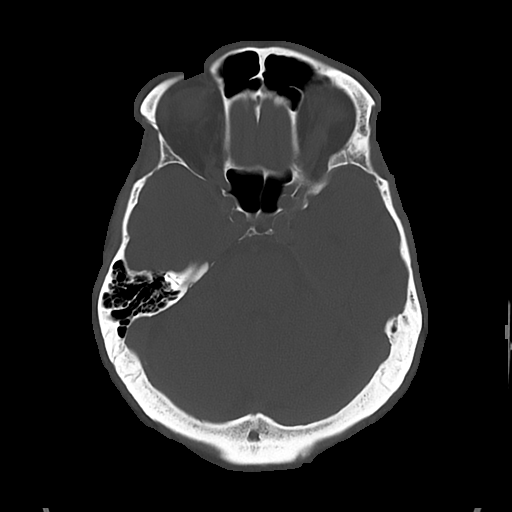

[16 of 47 positions shown; findings below may reference images not displayed]

FINDINGS: Brain: No evidence of acute infarction, hemorrhage, cerebral edema,
mass, mass effect, or midline shift. Ventricles and sulci are normal
for age. No extra-axial fluid collection.

Vascular: No hyperdense vessel or unexpected calcification.

Skull: Normal. Negative for fracture or focal lesion.

Sinuses/Orbits: Mild mucosal thickening in the ethmoid air cells.
Otherwise negative.

Other: The mastoid air cells are well aerated.

ASPECTS (Alberta Stroke Program Early CT Score)

- Ganglionic level infarction (caudate, lentiform nuclei, internal
capsule, insula, M1-M3 cortex): 7

- Supraganglionic infarction (M4-M6 cortex): 3

Total score (0-10 with 10 being normal): 10
IMPRESSION: 1. No acute intracranial process.
2. ASPECTS is 10

pm to provider Dr. EDUKACINES via secure text paging.

## 2021-04-25 MED ORDER — IOHEXOL 350 MG/ML SOLN
100.0000 mL | Freq: Once | INTRAVENOUS | Status: AC | PRN
  Administered 2021-04-25: 100 mL via INTRAVENOUS

## 2021-04-25 MED ORDER — MECLIZINE HCL 25 MG PO TABS
25.0000 mg | ORAL_TABLET | Freq: Once | ORAL | Status: AC
  Administered 2021-04-25: 25 mg via ORAL
  Filled 2021-04-25: qty 1

## 2021-04-25 MED ORDER — MECLIZINE HCL 25 MG PO TABS: 25.0000 mg | ORAL_TABLET | Freq: Three times a day (TID) | ORAL | 0 refills | Status: DC | PRN

## 2021-04-25 MED ORDER — SODIUM CHLORIDE 0.9 % IV BOLUS
1000.0000 mL | Freq: Once | INTRAVENOUS | Status: AC
  Administered 2021-04-25: 1000 mL via INTRAVENOUS

## 2021-04-25 NOTE — ED Notes (Signed)
Ambulated Pt with no problem. ?

## 2021-04-25 NOTE — ED Notes (Signed)
Evaluated by PA at triage and activated code stroke ?

## 2021-04-25 NOTE — Consult Note (Signed)
Neurology Consultation ? ?Reason for Consult: Code stroke for dizziness ?Referring Physician: Dr. Elnora Morrison, ED provider ? ?CC: Dizziness ? ?History is obtained from: Patient, chart ? ?HPI: Angel Lawson is a 66 y.o. male past medical history of hyperlipidemia, low testosterone levels for which she gets regular testosterone shots as his primary care hospital, coronary artery disease, presented to the hospital for evaluation of dizziness, chest and abdominal discomfort. ?He was driving to his primary care's office to get his testosterone shot when he suddenly started feeling dizzy-he describes that as feeling of lightheadedness.  He also had an abnormal sensation which was not pain but more of discomfort in his epigastric area in the abdomen and his chest.  Also reports feeling confused and disoriented at the primary care office ?He came to the emergency room for further evaluation with some improvement of symptoms but due to the persistence of symptoms of dizziness, a code stroke was called for the concern of posterior circulation stroke. ?I personally obtained of the history and confirmed the above history with him. ? ?Reports persistent lightheadedness/dizziness which is hard to describe for him despite my multiple attempts to get more clarity on the symptoms.  Due to abdominal somewhat chest discomfort along with neurological symptoms, in addition to CT head, CT angio head and neck, a CT a dissection protocol chest abdomen pelvis was done to rule out any evidence of aortic dissection. ? ?LKW: 9:30 AM ?tpa given?: no, no stroke on MRI ?Premorbid modified Rankin scale (mRS):0 ? ? ?ROS: Full ROS was performed and is negative except as noted in the HPI.  ? ?Past Medical History:  ?Diagnosis Date  ? Anxiety   ? High cholesterol   ? ?History reviewed. No pertinent family history. ? ?Social History:  ? reports that he has never smoked. He has never used smokeless tobacco. He reports that he does not drink  alcohol and does not use drugs. ? ?Medications ?No current facility-administered medications for this encounter. ?No current outpatient medications on file. ? ? ?Exam: ?Current vital signs: ?BP (!) 128/95 (BP Location: Right Arm)   Pulse 83   Temp 98.7 ?F (37.1 ?C) (Oral)   Resp 18   Wt 78.4 kg   SpO2 95%  ?Vital signs in last 24 hours: ?Temp:  [98.7 ?F (37.1 ?C)] 98.7 ?F (37.1 ?C) (03/08 1115) ?Pulse Rate:  [83-93] 83 (03/08 1204) ?Resp:  [14-18] 18 (03/08 1204) ?BP: (128-140)/(78-95) 128/95 (03/08 1204) ?SpO2:  [95 %-98 %] 95 % (03/08 1204) ?Weight:  [78.4 kg] 78.4 kg (03/08 1136) ?General: Awake alert in no distress ?HEENT: Normocephalic, atraumatic, dry oral mucous membranes, no lymphadenopathy ?Lungs clear ?Cardiovascular: Regular rate rhythm ?Extremities warm well perfused ?Abdomen nondistended nontender ?Neurological exam ?Awake alert oriented x3 ?No dysarthria ?No aphasia ?Cranial nerves II to XII intact ?Motor examination with no drift ?Sensation intact to light touch ?Coordination with no dysmetria ?NIH stroke scale-0 ? ?Labs ?I have reviewed labs in epic and the results pertinent to this consultation are: ? ? ?CBC ?   ?Component Value Date/Time  ? WBC 6.2 04/25/2021 1153  ? RBC 5.23 04/25/2021 1153  ? HGB 16.3 04/25/2021 1203  ? HCT 48.0 04/25/2021 1203  ? PLT 257 04/25/2021 1153  ? MCV 91.2 04/25/2021 1153  ? MCH 30.2 04/25/2021 1153  ? MCHC 33.1 04/25/2021 1153  ? RDW 13.0 04/25/2021 1153  ? LYMPHSABS 0.6 (L) 04/25/2021 1153  ? MONOABS 0.4 04/25/2021 1153  ? EOSABS 0.0 04/25/2021 1153  ? BASOSABS  0.0 04/25/2021 1153  ? ? ?CMP  ?   ?Component Value Date/Time  ? NA 141 04/25/2021 1203  ? K 3.9 04/25/2021 1203  ? CL 105 04/25/2021 1203  ? GLUCOSE 117 (H) 04/25/2021 1203  ? BUN 20 04/25/2021 1203  ? CREATININE 0.90 04/25/2021 1203  ? ? ?Lipid Panel  ?No results found for: CHOL, TRIG, HDL, CHOLHDL, VLDL, LDLCALC, LDLDIRECT ? ?Imaging ?I have reviewed the images obtained: ? ?CT-head stat-aspects 10.  No  acute findings. ?CT angiography head and neck-no emergent LVO.  Left PCA is derived from the carotid through fetal origin.  Dominant left vertebral artery. ? ?MRI examination of the brain-no stroke ? ?CTA chest/abd/pelis negative for dissection. Please see detailed radiology report for non-vascular f/u of a noted pancreatic lesion. ? ?Assessment:  ?66 year old man past medical history above, presenting for evaluation of dizziness-that he describes as lightheadedness as well as feeling a loss of balance along with some epigastric and chest discomfort and confusion. ?Concern for posterior circulation stroke-for which a code stroke was activated. ?CT head unremarkable. Advanced vascular imaging in the form of CT angiography head and neck also unremarkable for significant stenosis or any evidence of occlusion. ?Due to still mildly persistent symptoms, taken in for stat MRI of the brain as he still within the window for IV TNKase to evaluate for any diffusion restriction without flair changes which might help make a determination for IV thrombolysis.  ?Stat MRI-negative for acute process, no stroke.  ? ? ?Impression: ?Dizziness/lightheadedness ?MRI negative for stroke ? ?Recommendations: ?Cancel code stroke ?Continue work-up with labs including CBC BMP and UA ?Check urinary toxicology screen ?Check orthostatic vitals ?IV fluids ?Meclizine as needed ?If desired, may pursue further work-up in the form of echocardiogram if symptoms do not resolve-we will defer to ED. ? ?-- ?Amie Portland, MD ?Neurologist ?[facility] ?Pager: [phone] ? ?

## 2021-04-25 NOTE — ED Triage Notes (Addendum)
Pt arrived by [facility] from dr office. Reported having dizziness and "feeling weird"  and mild confusion onset around 0930 when driving to dr appt. Dr office reported patient having active epigastric pain in office that radiates into his chest and neck. Pt denies chest pain at this time, states he still feels mildly dizzy. Given ASA 324mg  pta  ?

## 2021-04-25 NOTE — ED Provider Triage Note (Signed)
Emergency Medicine Provider Triage Evaluation Note ? ?Angel Lawson , a 66 y.o. male  was evaluated in triage.  Pt complains of dizziness.  He was driving to his doctor's office at 9:30 AM this morning when he started experiencing dizziness.  He was able to complete his drive to the doctor's office and was seen there but was sent here to the ER.  He experienced epigastric pain/chest pain at the time but this has resolved.  States that overall his symptoms have improved since arrival to the ER.  Symptoms are mostly present when he ambulates and states that he feels "off balance" if he were to ambulate.  He was given full dose of aspirin at doctor's office.  He denies any headache, blurry vision, numbness in arms or legs, changes to speech, shortness of breath or current chest pain.  No injury or fall. ? ?Review of Systems  ?Positive: Dizziness, chest pain ?Negative: Numbness, weakness, headache, vision changes ? ?Physical Exam  ?BP 140/78 (BP Location: Right Arm)   Pulse 93   Temp 98.7 ?F (37.1 ?C) (Oral)   Resp 14   SpO2 98%  ?Gen:   Awake, no distress   ?Resp:  Normal effort  ?MSK:   Moves extremities without difficulty  ?Other:  Strength 5/5 in bilateral upper and lower extremities.  Pupils equal and reactive to light.  No facial asymmetry noted.  Normal gait noted.  Sensation intact to light touch of bilateral upper and lower extremities, face.  No aphasia. ? ?Medical Decision Making  ?Medically screening exam initiated at 11:44 AM.  Appropriate orders placed.  Angel Lawson was informed that the remainder of the evaluation will be completed by another provider, this initial triage assessment does not replace that evaluation, and the importance of remaining in the ED until their evaluation is complete. ? ?Spoke to Dr. Wilford Lawson, neurologist.  Because he is still describing dizziness will activate code stroke and he will see the patient in consult ?  ?Angel Pates, PA-C ?04/25/21 1153 ? ?

## 2021-04-25 NOTE — ED Provider Notes (Signed)
[facility] DEPART[facility] Provider Note   CSN: [id number] Arrival date & time: 04/25/21  1112     History  Chief Complaint  Patient presents with   Dizziness    Angel Lawson is a 66 y.o. male presenting to the ED with a chief complaint of dizziness.  At 9:30 AM on 04/25/2021, today he was driving to his doctor's office.  He began experiencing dizziness was able to complete his drive to the office.  When he arrived to the office he had a brief episode of confusion where he states that he "did not know where I was."  He started also experiencing chest pain/epigastric pain.  His provider sent him to the ER for his symptoms after giving him a full dose of aspirin.  He states that his symptoms have significantly improved although he is still experiencing dizziness.  He describes it as "feeling off balance" when he ambulates.  No symptoms at rest.  Denies any headache, vision changes, numbness in arms or legs, changes to speech, head injury, current chest pain or abdominal pain, vomiting.   Dizziness Associated symptoms: no blood in stool, no chest pain, no diarrhea, no nausea, no palpitations, no shortness of breath, no vomiting and no weakness       Home Medications Prior to Admission medications   Medication Sig Start Date End Date Taking? Authorizing Provider  ALPRAZolam Duanne Moron) 0.5 MG tablet Take 0.5 mg by mouth daily as needed for anxiety (stress). 12/27/20  Yes [provider]  aspirin EC 81 MG tablet Take 81 mg by mouth every evening. Swallow whole.   Yes [provider]  buPROPion (WELLBUTRIN XL) 300 MG 24 hr tablet Take 300 mg by mouth every morning. 03/05/21  Yes [provider]  fluticasone (FLONASE) 50 MCG/ACT nasal spray Place 1 spray into both nostrils daily as needed (seasonal allergies).   Yes [provider]  ibuprofen (ADVIL) 200 MG tablet Take 400-600 mg by mouth daily as needed for headache (pain).   Yes [provider]  meclizine (ANTIVERT) 25 MG tablet Take 1 tablet (25 mg total) by mouth 3 (three) times daily as needed for dizziness. 04/25/21  Yes Meral Geissinger, PA-C  Multiple Vitamin (MULTIVITAMIN WITH MINERALS) TABS tablet Take 1 tablet by mouth every evening.   Yes [provider]  rosuvastatin (CRESTOR) 20 MG tablet Take 20 mg by mouth at bedtime. 04/09/21  Yes [provider]  tadalafil (CIALIS) 5 MG tablet Take 5 mg by mouth every morning. 03/26/21  Yes [provider]  testosterone cypionate (DEPOTESTOSTERONE CYPIONATE) 200 MG/ML injection Inject 200 mg into the muscle every 21 ( twenty-one) days. Administered by Dr. Anastasia Pall 12/14/20  Yes [provider]      Allergies    Patient has no known allergies.    Review of Systems   Review of Systems  Constitutional:  Negative for appetite change, chills and fever.  HENT:  Negative for ear pain, rhinorrhea, sneezing and sore throat.   Eyes:  Negative for photophobia and visual disturbance.  Respiratory:  Negative for cough, chest tightness, shortness of breath and wheezing.   Cardiovascular:  Negative for chest pain and palpitations.  Gastrointestinal:  Negative for abdominal pain, blood in stool, constipation, diarrhea, nausea and vomiting.  Genitourinary:  Negative for dysuria, hematuria and urgency.  Musculoskeletal:  Negative for myalgias.  Skin:  Negative for rash.  Neurological:  Positive for dizziness. Negative for weakness and light-headedness.   Physical  Exam Updated Vital Signs BP 117/81    Pulse 82    Temp 98.7 F (37.1 C) (Oral)    Resp 14    Ht 5\' 9"  (1.753 m)    Wt 78.4 kg    SpO2 95%    BMI 25.52 kg/m  Physical Exam Vitals and nursing note reviewed.  Constitutional:      General: He is not in acute distress.    Appearance: He is well-developed.  HENT:     Head: Normocephalic and atraumatic.     Nose: Nose normal.  Eyes:     General: No scleral icterus.       Left eye: No  discharge.     Conjunctiva/sclera: Conjunctivae normal.  Cardiovascular:     Rate and Rhythm: Normal rate and regular rhythm.     Heart sounds: Normal heart sounds. No murmur heard.   No friction rub. No gallop.  Pulmonary:     Effort: Pulmonary effort is normal. No respiratory distress.     Breath sounds: Normal breath sounds.  Abdominal:     General: Bowel sounds are normal. There is no distension.     Palpations: Abdomen is soft.     Tenderness: There is no abdominal tenderness. There is no guarding.  Musculoskeletal:        General: Normal range of motion.     Cervical back: Normal range of motion and neck supple.  Skin:    General: Skin is warm and dry.     Findings: No rash.  Neurological:     General: No focal deficit present.     Mental Status: He is alert.     Cranial Nerves: No cranial nerve deficit.     Sensory: No sensory deficit.     Motor: No abnormal muscle tone.     Coordination: Coordination normal.     Comments: Pupils reactive. No facial asymmetry noted. Cranial nerves appear grossly intact. Sensation intact to light touch on face, BUE and BLE. Strength 5/5 in BUE and BLE. Normal gait.    ED Results / Procedures / Treatments   Labs (all labs ordered are listed, but only abnormal results are displayed) Labs Reviewed  DIFFERENTIAL - Abnormal; Notable for the following components:      Result Value   Lymphs Abs 0.6 (*)    All other components within normal limits  COMPREHENSIVE METABOLIC PANEL - Abnormal; Notable for the following components:   Glucose, Bld 119 (*)    Calcium 8.8 (*)    Total Protein 6.2 (*)    All other components within normal limits  RAPID URINE DRUG SCREEN, HOSP PERFORMED - Abnormal; Notable for the following components:   Tetrahydrocannabinol POSITIVE (*)    All other components within normal limits  URINALYSIS, ROUTINE W REFLEX MICROSCOPIC - Abnormal; Notable for the following components:   APPearance HAZY (*)    Leukocytes,Ua  TRACE (*)    Bacteria, UA RARE (*)    All other components within normal limits  I-STAT CHEM 8, ED - Abnormal; Notable for the following components:   Glucose, Bld 117 (*)    Calcium, Ion 1.10 (*)    All other components within normal limits  CBG MONITORING, ED - Abnormal; Notable for the following components:   Glucose-Capillary 109 (*)    All other components within normal limits  CBG MONITORING, ED - Abnormal; Notable for the following components:   Glucose-Capillary 104 (*)    All other components within normal limits  RESP  PANEL BY RT-PCR (FLU A&B, COVID) ARPGX2  ETHANOL  PROTIME-INR  APTT  CBC  TROPONIN I (HIGH SENSITIVITY)  TROPONIN I (HIGH SENSITIVITY)    EKG EKG Interpretation  Date/Time:  Wednesday April 25 2021 11:15:26 EST Ventricular Rate:  82 PR Interval:  146 QRS Duration: 150 QT Interval:  404 QTC Calculation: 472 R Axis:   -65 Text Interpretation: Normal sinus rhythm Right bundle branch block Left anterior fascicular block ** Bifascicular block ** Abnormal ECG no prior ECG for comparison. No STEMI Confirmed by Theda Belfastegeler, Chris ([id number]) on 04/25/2021 11:48:45 AM  Radiology CT ANGIO HEAD NECK W WO CM  Result Date: 04/25/2021 CLINICAL DATA:  Neuro deficit, stroke suspected EXAM: CT ANGIOGRAPHY HEAD AND NECK TECHNIQUE: Multidetector CT imaging of the head and neck was performed using the standard protocol during bolus administration of intravenous contrast. Multiplanar CT image reconstructions and MIPs were obtained to evaluate the vascular anatomy. Carotid stenosis measurements (when applicable) are obtained utilizing NASCET criteria, using the distal internal carotid diameter as the denominator. RADIATION DOSE REDUCTION: This exam was performed according to the departmental dose-optimization program which includes automated exposure control, adjustment of the mA and/or kV according to patient size and/or use of iterative reconstruction technique. CONTRAST:  100mL  OMNIPAQUE IOHEXOL 350 MG/ML SOLN COMPARISON:  Same-day noncontrast head CT. FINDINGS: CTA NECK FINDINGS Aortic arch: There is minimal calcified atherosclerotic plaque in the aortic arch. The origins of the major branch vessels are patent. The subclavian arteries are patent to the level imaged. Right carotid system: The right common, internal, and external carotid arteries are patent, without hemodynamically significant stenosis or occlusion. There is minimal plaque in the proximal right ICA. There is no evidence of dissection or aneurysm. Left carotid system: The left common, internal, and external carotid arteries are patent, without hemodynamically significant stenosis or occlusion. There is minimal plaque in the proximal left ICA. There is no evidence of dissection or aneurysm. Vertebral arteries: The vertebral arteries are patent, without hemodynamically significant stenosis or occlusion. There is no dissection or aneurysm. Skeleton: There is degenerative change of the cervical spine most advanced at C5-C6. There is no acute osseous abnormality or aggressive osseous lesion. There is no visible canal hematoma. Other neck: The soft tissues are unremarkable. Upper chest: The imaged lung apices are clear. Review of the MIP images confirms the above findings CTA HEAD FINDINGS Anterior circulation: The intracranial ICAs are patent Bilateral MCAs are patent. The bilateral A1 segments are patent. There is an azygous ACA, with the distal branches arising from a common A2 trunk, a normal variant. There is no significant stenosis or occlusion. There is no aneurysm or AVM. Posterior circulation: The bilateral V4 segments are patent. The basilar artery is patent. The bilateral PCAs are patent. There is a fetal origin of the left PCA. The right posterior communicating artery is also identified. There is no aneurysm or AVM. Venous sinuses: Patent. Anatomic variants: As above. Review of the MIP images confirms the above  findings IMPRESSION: 1. Patent vasculature of the head and neck. 2. Minimal atherosclerotic plaque at the carotid bifurcations without hemodynamically significant stenosis. Electronically Signed   By: Lesia HausenPeter  Noone M.D.   On: 04/25/2021 12:49   MR BRAIN WO CONTRAST  Result Date: 04/25/2021 CLINICAL DATA:  Neuro deficit, acute, stroke suspected.  Dizziness. EXAM: MRI HEAD WITHOUT CONTRAST TECHNIQUE: Multiplanar, multiecho pulse sequences of the brain and surrounding structures were obtained without intravenous contrast. COMPARISON:  Head CT and CTA 04/25/2021 FINDINGS: Brain: There is  no evidence of an acute infarct, intracranial hemorrhage, mass, midline shift, or extra-axial fluid collection. The ventricles and sulci are within normal limits for age. Small T2 hyperintensities in the cerebral white matter bilaterally are nonspecific but compatible with mild chronic small vessel ischemic disease. No focal cerebellar insult is evident. Vascular: Major intracranial vascular flow voids are preserved. Skull and upper cervical spine: Unremarkable bone marrow signal. Sinuses/Orbits: Unremarkable orbits. Mild mucosal thickening in right ethmoid air cells. Clear mastoid air cells. Other: None. IMPRESSION: 1. No acute intracranial abnormality. 2. Mild chronic small vessel ischemic disease. Electronically Signed   By: Logan Bores M.D.   On: 04/25/2021 13:48   CT Angio Chest/Abd/Pel for Dissection W and/or W/WO  Result Date: 04/25/2021 CLINICAL DATA:  Chest pain or back pain, aortic dissection suspected EXAM: CT ANGIOGRAPHY CHEST, ABDOMEN AND PELVIS TECHNIQUE: Non-contrast CT of the chest was initially obtained. Multidetector CT imaging through the chest, abdomen and pelvis was performed using the standard protocol during bolus administration of intravenous contrast. Multiplanar reconstructed images and MIPs were obtained and reviewed to evaluate the vascular anatomy. RADIATION DOSE REDUCTION: This exam was performed  according to the departmental dose-optimization program which includes automated exposure control, adjustment of the mA and/or kV according to patient size and/or use of iterative reconstruction technique. CONTRAST:  119mL OMNIPAQUE IOHEXOL 350 MG/ML SOLN COMPARISON:  None. FINDINGS: CTA CHEST FINDINGS Cardiovascular: Thoracic aorta is normal in caliber with minor calcified plaque. No evidence of dissection. Heart size is normal. No pericardial effusion. Coronary artery calcification. Mediastinum/Nodes: Included thyroid is unremarkable. No enlarged nodes. Lungs/Pleura: Mild atelectasis/scarring at the lung bases. No pleural effusion or pneumothorax. Musculoskeletal: Degenerative changes of the thoracic spine. Review of the MIP images confirms the above findings. CTA ABDOMEN AND PELVIS FINDINGS VASCULAR Aorta: Normal caliber. Primarily calcified plaque is present. No evidence dissection. Celiac: Patent. SMA: Patent. Renals: Patent. IMA: Patent. Inflow: Patent.  Calcified plaque. Veins: Poorly evaluated Review of the MIP images confirms the above findings. NON-VASCULAR Hepatobiliary: No focal liver abnormality is seen. No gallstones, gallbladder wall thickening, or biliary dilatation. Pancreas: 2.9 cm exophytic low-density lesion of the body. No pancreatic ductal dilatation or surrounding inflammatory changes. Spleen: Unremarkable. Adrenals/Urinary Tract: Adrenals are unremarkable. Bilateral renal cysts. Bladder is unremarkable. Stomach/Bowel: Stomach is within normal limits. Bowel is normal in caliber. Normal appendix. Distal colonic diverticulosis. Lymphatic: No enlarged nodes. Reproductive: Enlarged prostate. Other: No free fluid.  No acute abnormality of the abdominal wall. Musculoskeletal: Degenerative changes of the lumbar spine. Review of the MIP images confirms the above findings. IMPRESSION: No evidence of aortic dissection.  Atherosclerosis. 2.9 cm exophytic low-density lesion of the body of the pancreas.  Recommend nonemergent MRI/MRCP preferably as an outpatient when patient is better able to tolerate the study. Colonic diverticulosis. Electronically Signed   By: Macy Mis M.D.   On: 04/25/2021 12:48   CT HEAD CODE STROKE WO CONTRAST  Result Date: 04/25/2021 CLINICAL DATA:  Code stroke. EXAM: CT HEAD WITHOUT CONTRAST TECHNIQUE: Contiguous axial images were obtained from the base of the skull through the vertex without intravenous contrast. RADIATION DOSE REDUCTION: This exam was performed according to the departmental dose-optimization program which includes automated exposure control, adjustment of the mA and/or kV according to patient size and/or use of iterative reconstruction technique. COMPARISON:  None. FINDINGS: Brain: No evidence of acute infarction, hemorrhage, cerebral edema, mass, mass effect, or midline shift. Ventricles and sulci are normal for age. No extra-axial fluid collection. Vascular: No hyperdense vessel or unexpected  calcification. Skull: Normal. Negative for fracture or focal lesion. Sinuses/Orbits: Mild mucosal thickening in the ethmoid air cells. Otherwise negative. Other: The mastoid air cells are well aerated. ASPECTS [facility] Stroke Program Early CT Score) - Ganglionic level infarction (caudate, lentiform nuclei, internal capsule, insula, M1-M3 cortex): 7 - Supraganglionic infarction (M4-M6 cortex): 3 Total score (0-10 with 10 being normal): 10 IMPRESSION: 1. No acute intracranial process. 2. ASPECTS is 10 Code stroke imaging results were communicated on 04/25/2021 at 12:16 pm to provider Dr. Rory Percy via secure text paging. Electronically Signed   By: Merilyn Baba M.D.   On: 04/25/2021 12:16    Procedures Procedures    Medications Ordered in ED Medications  iohexol (OMNIPAQUE) 350 MG/ML injection 100 mL (100 mLs Intravenous Contrast Given 04/25/21 1231)  sodium chloride 0.9 % bolus 1,000 mL (0 mLs Intravenous Stopped 04/25/21 1513)  meclizine (ANTIVERT) tablet 25 mg (25 mg Oral  Given 04/25/21 1420)    ED Course/ Medical Decision Making/ A&P Clinical Course as of 04/25/21 1539  Wed Apr 25, 2021  1217 I evaluated the patient in triage, I spoke to on-call neurologist Dr. Rory Percy regarding his symptoms.  As he is still experiencing dizziness we will call a code stroke. [HK]  1230 Patient evaluated by neurologist, Dr. Rory Percy.  He is obtaining CT angio of the head and neck as well as CT angio dissection study, as well as an MRI [HK]  1256 Leukocytes,Ua(!): TRACE [HK]  1256 Bacteria, UA(!): RARE [HK]  1256 Tetrahydrocannabinol(!): POSITIVE [HK]  1256 Potassium: 3.9 [HK]  1256 Hemoglobin: 15.8 [HK]  1326 Troponin I (High Sensitivity): 6 [HK]  1414 MRI is negative for acute abnormality. [HK]  1447 Troponin I (High Sensitivity): 5 [HK]    Clinical Course User Index [HK] Delia Heady, PA-C                           Medical Decision Making Amount and/or Complexity of Data Reviewed External Data Reviewed: labs and radiology. Labs: ordered. Decision-making details documented in ED Course. Radiology: ordered.   66 year old male presenting to the ED with a chief complaint of dizziness.  At 9:30 AM today he was driving to his doctor's office when he began experiencing dizziness.  He was able to complete his drive to the office and had a brief episode of confusion when he arrived.  He was also experiencing chest pain epigastric pain and was sent to the ER.  He describes his dizziness as feeling "off balance" when he ambulates.  Denies any symptoms at rest.  States that he overall feels improved since symptoms began.  However he is still describing "dizziness."  On exam patient has no neurological deficits.  He has no numbness or weakness on exam.  No facial asymmetry.  He has a normal gait when he ambulated without assistance.  However due to still experiencing dizziness and timeframe of last known normal a code stroke was called after speaking to neurology.  He was evaluated by  neurology at the bedside.  He has had a CT scan of his head, CT angio of his head and neck as well as an MRI of his brain without any abnormalities.  Due to chest pain as well as dizziness he also obtained CT dissection study which was negative.  There is an incidental finding of a 2.9 cm pancreatic lesion which I have informed him of and added to his problem list.  On recheck patient's symptoms have significantly  improved after IV fluids and meclizine.  Per Dr. Malen Gauze recommendations does not feel that this is related to a TIA as his MRI is normal here and his symptoms have improved.  Suspect that symptoms could be due to vertigo.  As far as his lab work, unremarkable CBC, CMP, troponins are negative x2.  His EKG shows bifasicular block but no priors for comparison.  Ethanol and coag labs are unremarkable.  Urinalysis is unremarkable and patient asymptomatic.  Because of the resolution of his symptoms and reassuring work-up I feel that he will benefit from outpatient follow-up and meclizine at home.  He is agreeable to this plan.  He ambulated in the difficulty after medications given.  He is requesting discharge home.  Return precautions given    Patient is hemodynamically stable, in NAD, and able to ambulate in the ED. Evaluation does not show pathology that would require ongoing emergent intervention or inpatient treatment. I explained the diagnosis to the patient. Pain has been managed and has no complaints prior to discharge. Patient is comfortable with above plan and is stable for discharge at this time. All questions were answered prior to disposition. Strict return precautions for returning to the ED were discussed. Encouraged follow up with PCP.   An After Visit Summary was printed and given to the patient.   Portions of this note were generated with [employer]. Dictation errors may occur despite best attempts at proofreading.         Final Clinical Impression(s) / ED  Diagnoses Final diagnoses:  Vertigo  Pancreatic lesion    Rx / DC Orders ED Discharge Orders          Ordered    meclizine (ANTIVERT) 25 MG tablet  3 times daily PRN        04/25/21 1537              Delia Heady, PA-C 04/25/21 1539    Tegeler, Gwenyth Allegra, MD 04/25/21 1652

## 2021-04-25 NOTE — Code Documentation (Addendum)
Angel Lawson is a 66 yr old man with h/o anxiety, hypercholesterolemia and low testosterone.He is on no blood thinners. He was in his usual state of health today at 0930. After this time, while he was driving to a scheduled MD appointment, he began feeling dizzy amd a little confused. At the Dr's office, he had "epigastric pain... that radiates to his chest and neck". EMS dispatched to retrieve pt. Pt arrived at [facility] via [facility] at 1112. At time of admission, he was no longer feeling any discomfort, but still felt dizzy. Code stroke was activated at 1159. Blood work had been obtained. Weight and CBG obtained during brief wait for CT scan. Pt to CT at 1205. CTNC negative for acute abnormality per Dr .Wilford Corner. IV access obtained for advanced imaging. CTA obtained. CTA negative for LVO per Dr Wilford Corner. Pt taken for Stat MRI for treatment  (thrombolytic) decision. Pt remains NIHSS 0, yet remains slightly dizzy. MRI negative for acute stroke per Dr Wilford Corner. Code stroke cancelled at 1330. Pt returned to ED room 3. Handoff given to [employer]. ?

## 2021-04-25 NOTE — Discharge Instructions (Addendum)
Take the meclizine as needed to help with your symptoms. ?Follow-up with your primary care provider. ?Your CT scan showed incidental finding of a 2.9 cm lesion on the pancreas which will require outpatient work-up.  Please tell your primary care provider about this finding. ?Return to the ER if you start to experience worsening symptoms, increased dizziness, severe headache, blurry vision, numbness in arms or legs or increased chest pain ?

## 2021-05-22 ENCOUNTER — Encounter (HOSPITAL_COMMUNITY): Payer: Self-pay | Admitting: Radiology

## 2021-07-08 ENCOUNTER — Other Ambulatory Visit: Payer: Self-pay

## 2021-07-08 ENCOUNTER — Encounter (HOSPITAL_BASED_OUTPATIENT_CLINIC_OR_DEPARTMENT_OTHER): Payer: Self-pay | Admitting: *Deleted

## 2021-07-08 ENCOUNTER — Inpatient Hospital Stay (HOSPITAL_BASED_OUTPATIENT_CLINIC_OR_DEPARTMENT_OTHER)
Admission: EM | Admit: 2021-07-08 | Discharge: 2021-07-11 | DRG: 418 | Disposition: A | Discharge status: Home / Self Care | Payer: Medicare Other | Attending: Family Medicine | Admitting: Family Medicine

## 2021-07-08 ENCOUNTER — Emergency Department (HOSPITAL_BASED_OUTPATIENT_CLINIC_OR_DEPARTMENT_OTHER): Payer: Medicare Other

## 2021-07-08 DIAGNOSIS — R7989 Other specified abnormal findings of blood chemistry: Secondary | ICD-10-CM | POA: Diagnosis not present

## 2021-07-08 DIAGNOSIS — K851 Biliary acute pancreatitis without necrosis or infection: Principal | ICD-10-CM | POA: Diagnosis present

## 2021-07-08 DIAGNOSIS — K8010 Calculus of gallbladder with chronic cholecystitis without obstruction: Secondary | ICD-10-CM | POA: Diagnosis present

## 2021-07-08 DIAGNOSIS — R748 Abnormal levels of other serum enzymes: Secondary | ICD-10-CM | POA: Diagnosis not present

## 2021-07-08 DIAGNOSIS — K8689 Other specified diseases of pancreas: Principal | ICD-10-CM

## 2021-07-08 DIAGNOSIS — Z79899 Other long term (current) drug therapy: Secondary | ICD-10-CM | POA: Diagnosis not present

## 2021-07-08 DIAGNOSIS — K8050 Calculus of bile duct without cholangitis or cholecystitis without obstruction: Secondary | ICD-10-CM

## 2021-07-08 DIAGNOSIS — E7800 Pure hypercholesterolemia, unspecified: Secondary | ICD-10-CM | POA: Diagnosis present

## 2021-07-08 DIAGNOSIS — K760 Fatty (change of) liver, not elsewhere classified: Secondary | ICD-10-CM | POA: Diagnosis present

## 2021-07-08 DIAGNOSIS — Z7982 Long term (current) use of aspirin: Secondary | ICD-10-CM

## 2021-07-08 DIAGNOSIS — K429 Umbilical hernia without obstruction or gangrene: Secondary | ICD-10-CM | POA: Diagnosis not present

## 2021-07-08 DIAGNOSIS — F411 Generalized anxiety disorder: Secondary | ICD-10-CM | POA: Diagnosis present

## 2021-07-08 DIAGNOSIS — K5730 Diverticulosis of large intestine without perforation or abscess without bleeding: Secondary | ICD-10-CM | POA: Diagnosis present

## 2021-07-08 DIAGNOSIS — K8511 Biliary acute pancreatitis with uninfected necrosis: Secondary | ICD-10-CM | POA: Diagnosis not present

## 2021-07-08 DIAGNOSIS — K862 Cyst of pancreas: Secondary | ICD-10-CM | POA: Diagnosis present

## 2021-07-08 DIAGNOSIS — K824 Cholesterolosis of gallbladder: Secondary | ICD-10-CM | POA: Diagnosis present

## 2021-07-08 DIAGNOSIS — K8012 Calculus of gallbladder with acute and chronic cholecystitis without obstruction: Secondary | ICD-10-CM | POA: Diagnosis not present

## 2021-07-08 LAB — LIPASE, BLOOD: Lipase: >10000 U/L — ABNORMAL HIGH (ref 11–51)

## 2021-07-08 LAB — CBC
HCT: 51.3 % (ref 39.0–52.0)
Hemoglobin: 16.8 g/dL (ref 13.0–17.0)
MCH: 29.5 pg (ref 26.0–34.0)
MCHC: 32.7 g/dL (ref 30.0–36.0)
MCV: 90 fL (ref 80.0–100.0)
Platelets: 276 10*3/uL (ref 150–400)
RBC: 5.7 MIL/uL (ref 4.22–5.81)
RDW: 13.7 % (ref 11.5–15.5)
WBC: 8.9 10*3/uL (ref 4.0–10.5)
nRBC: 0 % (ref 0.0–0.2)

## 2021-07-08 LAB — COMPREHENSIVE METABOLIC PANEL
ALT: 217 U/L — ABNORMAL HIGH (ref 0–44)
AST: 226 U/L — ABNORMAL HIGH (ref 15–41)
Albumin: 4.5 g/dL (ref 3.5–5.0)
Alkaline Phosphatase: 74 U/L (ref 38–126)
Anion gap: 13 (ref 5–15)
BUN: 16 mg/dL (ref 8–23)
CO2: 25 mmol/L (ref 22–32)
Calcium: 9.5 mg/dL (ref 8.9–10.3)
Chloride: 104 mmol/L (ref 98–111)
Creatinine, Ser: 1.06 mg/dL (ref 0.61–1.24)
GFR, Estimated: >60 mL/min (ref >60)
Glucose, Bld: 121 mg/dL — ABNORMAL HIGH (ref 70–99)
Potassium: 3.7 mmol/L (ref 3.5–5.1)
Sodium: 142 mmol/L (ref 135–145)
Total Bilirubin: 4.3 mg/dL — ABNORMAL HIGH (ref 0.3–1.2)
Total Protein: 7.2 g/dL (ref 6.5–8.1)

## 2021-07-08 LAB — GLUCOSE, CAPILLARY: Glucose-Capillary: 122 mg/dL — ABNORMAL HIGH (ref 70–99)

## 2021-07-08 LAB — MRSA NEXT GEN BY PCR, NASAL: MRSA by PCR Next Gen: NOT DETECTED

## 2021-07-08 LAB — TROPONIN I (HIGH SENSITIVITY): Troponin I (High Sensitivity): 4 ng/L (ref <18)

## 2021-07-08 IMAGING — CT CT ABD-PELV W/ CM
2 of 5 series · 15 of 46 positions shown, 17 images · IV contrast (APPLIED)
Comparison: CT chest, abdomen and Pelvis [DATE].

CLINICAL DATA: 66-year-old male with nausea vomiting, abdominal
pain, pancreatic cystic mass in [REDACTED].

EXAM:
CT ABDOMEN AND PELVIS WITH CONTRAST
TECHNIQUE: Multidetector CT imaging of the abdomen and pelvis was performed
using the standard protocol following bolus administration of
intravenous contrast.

[Series 2: abd pel w · axial · 0.69mm/px · z∈[+387,+792]mm · 12 of 178 slices shown, 14 images]
[im 8/178  soft-tissue]
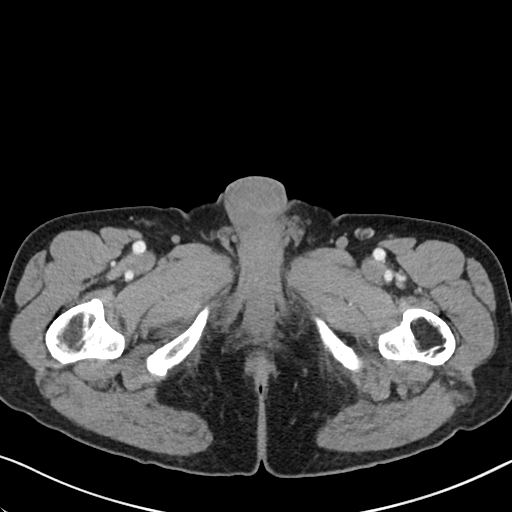
[im 8/178  bone]
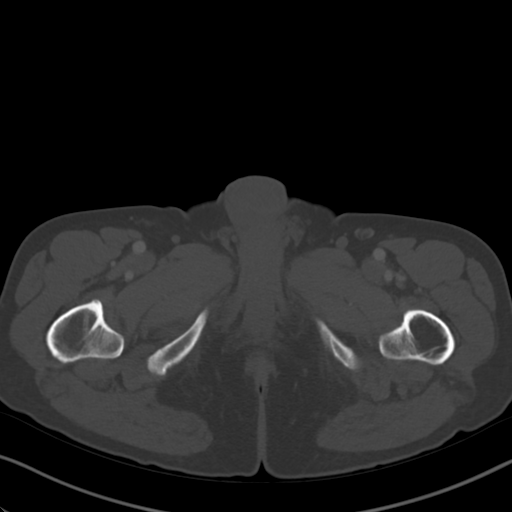
[im 23/178  soft-tissue]
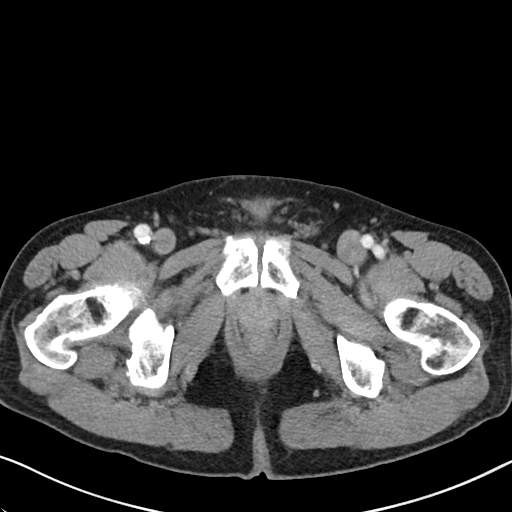
[im 37/178  soft-tissue]
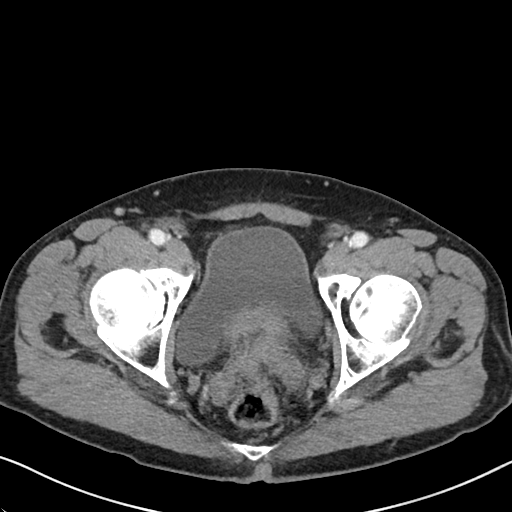
[im 52/178  soft-tissue]
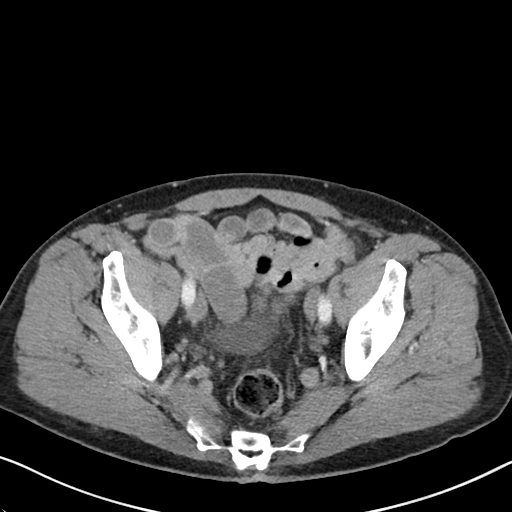
[im 67/178  soft-tissue]
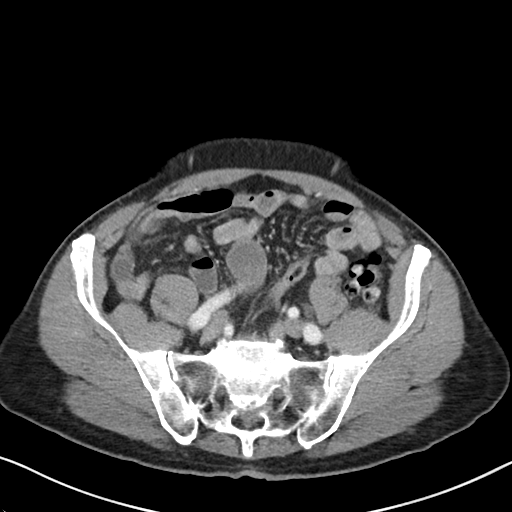
[im 82/178  soft-tissue]
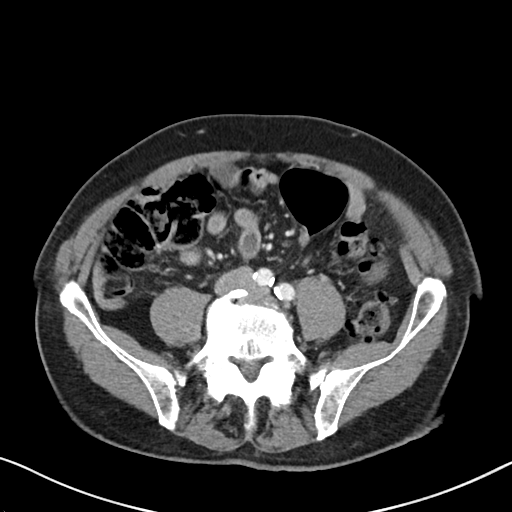
[im 96/178  soft-tissue]
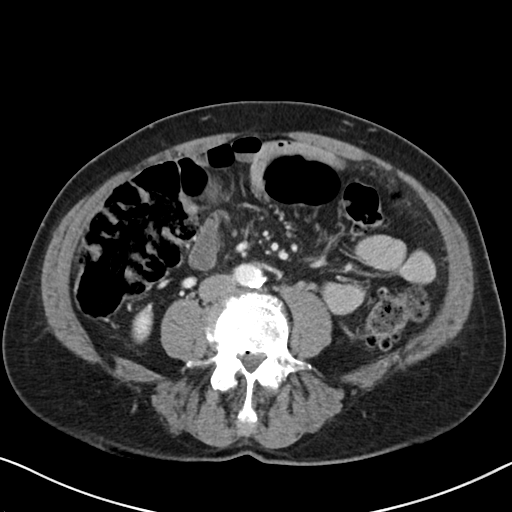
[im 111/178  soft-tissue]
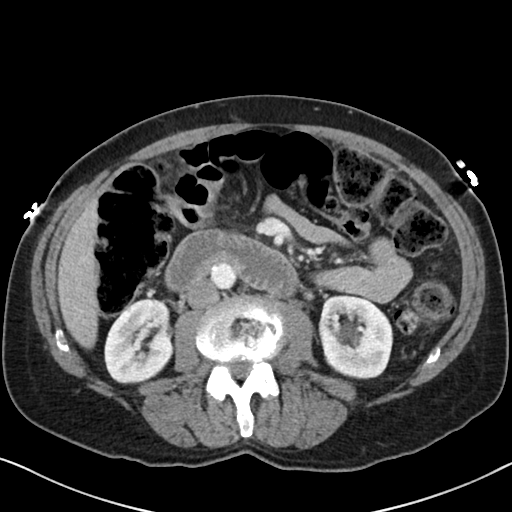
[im 126/178  soft-tissue]
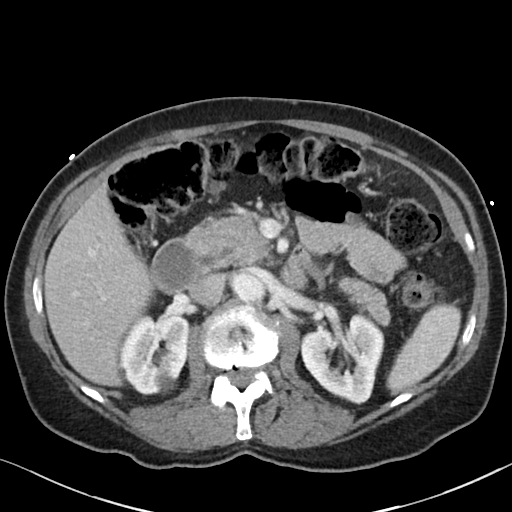
[im 126/178  bone]
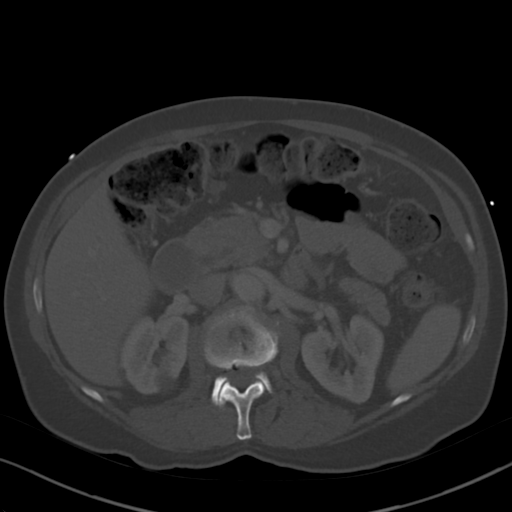
[im 141/178  soft-tissue]
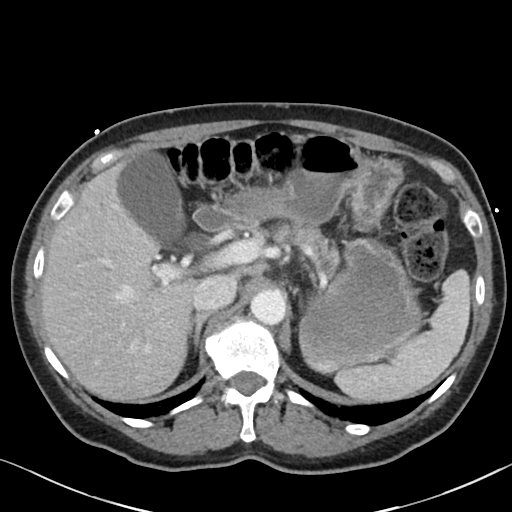
[im 155/178  soft-tissue]
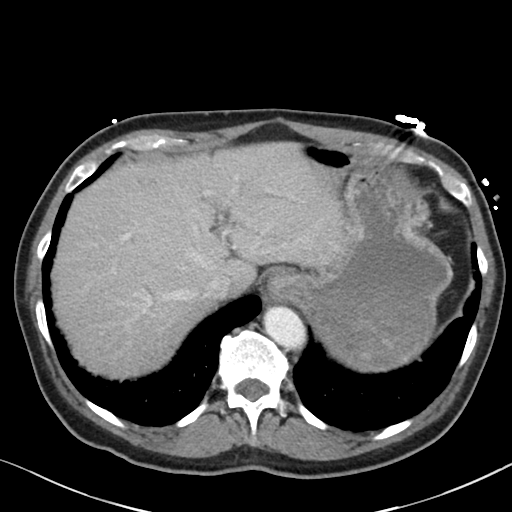
[im 170/178  soft-tissue]
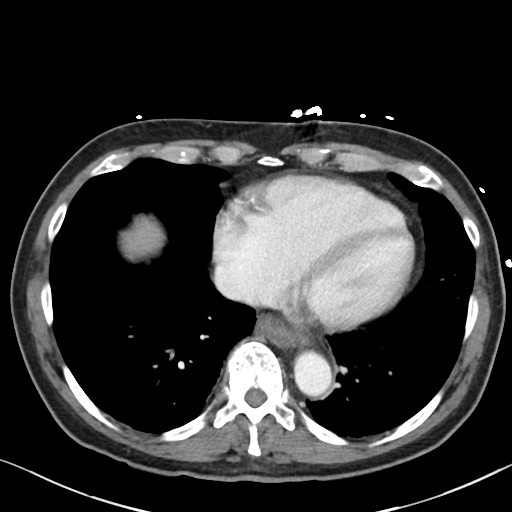

[Series 5: coronal · coronal · 0.69mm/px · 3 of 91 slices shown]
[im 31/91  soft-tissue]
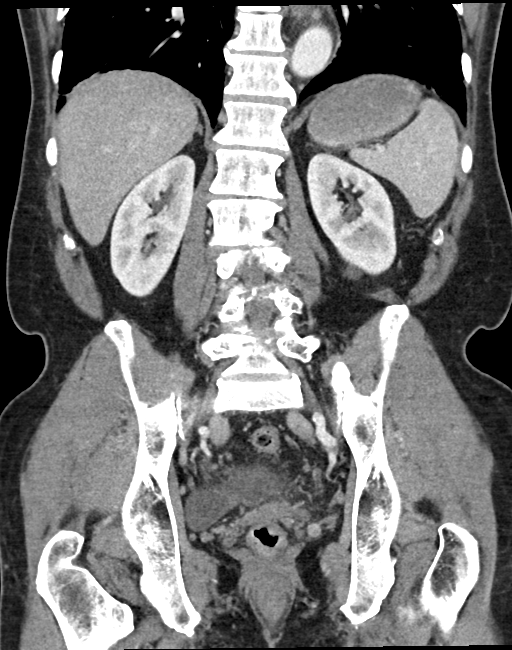
[im 41/91  soft-tissue]
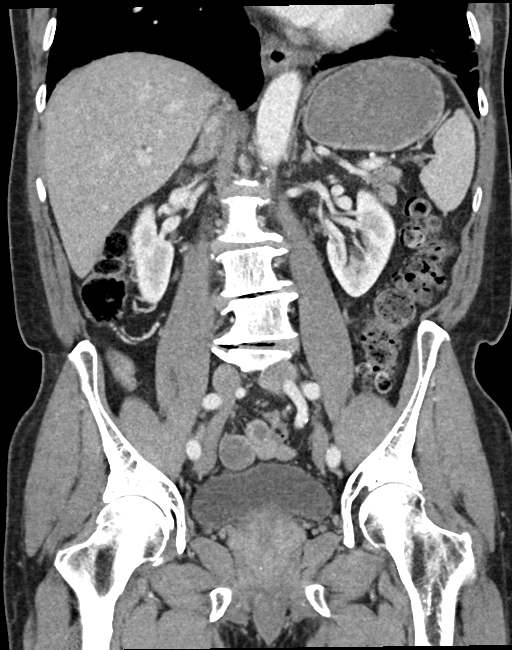
[im 51/91  soft-tissue]
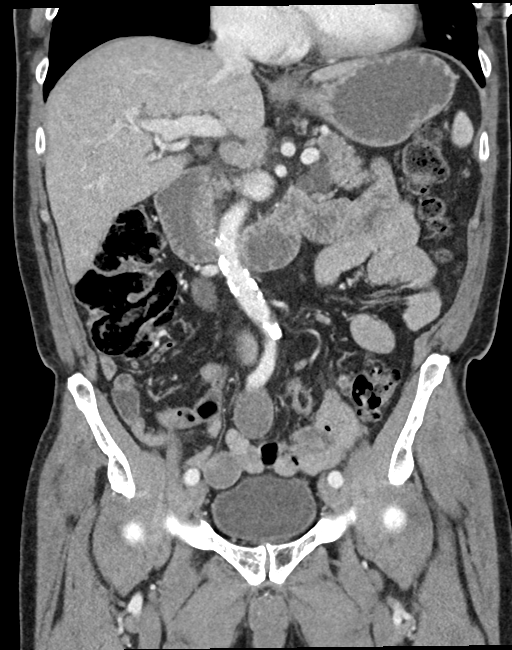

[15 of 46 positions shown; findings below may reference images not displayed]

RADIATION DOSE REDUCTION: This exam was performed according to the
departmental dose-optimization program which includes automated
exposure control, adjustment of the mA and/or kV according to
patient size and/or use of iterative reconstruction technique.

CONTRAST:  100mL OMNIPAQUE IOHEXOL 300 MG/ML  SOLN
FINDINGS: Lower chest: Mild lung base atelectasis and/or scarring has not
significantly changed. Cardiac size at the upper limits of normal.
No pericardial or pleural effusion.

Hepatobiliary: Gallbladder more distended today but otherwise
appears normal. CBD is larger, 8 mm now versus 5 mm in [REDACTED]. The
CBD appears to taper distally. Small 2-3 mm calcification in the
duodenum on coronal image 53, which could be at the and few low.
Mild new central hepatic duct prominence. Liver enhancement remains
normal.

Pancreas: Bilobed cystic mass of the pancreatic body measures about
16 x 20 x 45 mm (AP by transverse by CC) versus 16 x 11 x 28 mm in
[REDACTED]. There is mild prominence of the main pancreatic duct. No
pancreatic inflammation or interval atrophy identified. No
peripancreatic lymphadenopathy identified.

Spleen: Negative.

Adrenals/Urinary Tract: Stable and essentially negative. Small
benign appearing renal cysts (no follow-up imaging recommended).

Stomach/Bowel: Redundant sigmoid colon tracks into the epigastrium.
Gas and retained stool in the large bowel. Severe diverticulosis in
the descending colon and at the junction with the sigmoid. No active
inflammation identified. Normal appendix on series 2, image 100. No
dilated small bowel. Cecum appears to beyond a lax mesentery.
Fluid-filled stomach and proximal duodenum today. But no convincing
gastroduodenal inflammation. No free air or free fluid identified.

Vascular/Lymphatic: Aortoiliac calcified atherosclerosis. Normal
caliber abdominal aorta. Major arterial structures remain patent.
Portal venous system is patent. No lymphadenopathy identified.

Reproductive: Negative.

Other: No pelvic free fluid.

Musculoskeletal: Widespread lumbar vacuum disc and endplate
spurring. No acute osseous abnormality identified.
IMPRESSION: 1. Larger bilobed cystic mass of the pancreatic body since [REDACTED],
now up to 4.5 cm long axis (versus 3 cm previously). This is
suspicious for a cystic Pancreatic Neoplasm, and there is associated
mild prominence of the main pancreatic duct. Recommend further
evaluation. No acute pancreatic inflammation or interval atrophy.

2. New dilatation of the CBD, with possible obstructing 2-3 mm stone
at the ampulla. Query hyperbilirubinemia.

3. No other acute or inflammatory process identified in the abdomen
or pelvis.
Severe diverticulosis of the descending and sigmoid colon without
active inflammation.
Aortic Atherosclerosis ([VM]-[VM]).

## 2021-07-08 MED ORDER — LIDOCAINE VISCOUS HCL 2 % MT SOLN
15.0000 mL | Freq: Once | OROMUCOSAL | Status: AC
  Administered 2021-07-08: 15 mL via ORAL
  Filled 2021-07-08: qty 15

## 2021-07-08 MED ORDER — ONDANSETRON HCL 4 MG/2ML IJ SOLN
4.0000 mg | Freq: Three times a day (TID) | INTRAMUSCULAR | Status: DC | PRN
Start: 2021-07-08 — End: 2021-07-08

## 2021-07-08 MED ORDER — ALUM & MAG HYDROXIDE-SIMETH 200-200-20 MG/5ML PO SUSP
30.0000 mL | Freq: Once | ORAL | Status: AC
  Administered 2021-07-08: 30 mL via ORAL
  Filled 2021-07-08: qty 30

## 2021-07-08 MED ORDER — FENTANYL CITRATE PF 50 MCG/ML IJ SOSY: 100.0000 ug | PREFILLED_SYRINGE | INTRAMUSCULAR | Status: DC | PRN

## 2021-07-08 MED ORDER — HYDROMORPHONE HCL 1 MG/ML IJ SOLN: 1.0000 mg | INTRAMUSCULAR | Status: DC | PRN

## 2021-07-08 MED ORDER — SODIUM CHLORIDE 0.9 % IV BOLUS
1000.0000 mL | Freq: Once | INTRAVENOUS | Status: AC
Start: 2021-07-08 — End: 2021-07-08
  Administered 2021-07-08: 1000 mL via INTRAVENOUS

## 2021-07-08 MED ORDER — ASPIRIN 81 MG PO TBEC
81.0000 mg | DELAYED_RELEASE_TABLET | Freq: Every evening | ORAL | Status: DC
  Administered 2021-07-08 – 2021-07-10 (×3): 81 mg via ORAL
  Filled 2021-07-08 (×3): qty 1

## 2021-07-08 MED ORDER — SODIUM CHLORIDE 0.9 % IV SOLN: INTRAVENOUS | Status: DC

## 2021-07-08 MED ORDER — ACETAMINOPHEN 325 MG PO TABS: 650.0000 mg | ORAL_TABLET | Freq: Four times a day (QID) | ORAL | Status: DC | PRN

## 2021-07-08 MED ORDER — PANTOPRAZOLE SODIUM 40 MG IV SOLR
40.0000 mg | Freq: Once | INTRAVENOUS | Status: AC
  Administered 2021-07-08: 40 mg via INTRAVENOUS
  Filled 2021-07-08: qty 10

## 2021-07-08 MED ORDER — CHLORHEXIDINE GLUCONATE CLOTH 2 % EX PADS
6.0000 | MEDICATED_PAD | Freq: Every day | CUTANEOUS | Status: DC
  Administered 2021-07-08 – 2021-07-09 (×2): 6 via TOPICAL

## 2021-07-08 MED ORDER — BUPROPION HCL ER (XL) 300 MG PO TB24
300.0000 mg | ORAL_TABLET | Freq: Every morning | ORAL | Status: DC
  Administered 2021-07-09 – 2021-07-11 (×2): 300 mg via ORAL
  Filled 2021-07-08 (×2): qty 1

## 2021-07-08 MED ORDER — IOHEXOL 300 MG/ML  SOLN
100.0000 mL | Freq: Once | INTRAMUSCULAR | Status: AC | PRN
  Administered 2021-07-08: 100 mL via INTRAVENOUS

## 2021-07-08 MED ORDER — ONDANSETRON HCL 4 MG/2ML IJ SOLN
4.0000 mg | Freq: Once | INTRAMUSCULAR | Status: AC | PRN
  Administered 2021-07-08: 4 mg via INTRAVENOUS
  Filled 2021-07-08: qty 2

## 2021-07-08 MED ORDER — ACETAMINOPHEN 650 MG RE SUPP: 650.0000 mg | Freq: Four times a day (QID) | RECTAL | Status: DC | PRN

## 2021-07-08 MED ORDER — ROSUVASTATIN CALCIUM 20 MG PO TABS
20.0000 mg | ORAL_TABLET | Freq: Every day | ORAL | Status: DC
Start: 2021-07-08 — End: 2021-07-11
  Administered 2021-07-08 – 2021-07-10 (×3): 20 mg via ORAL
  Filled 2021-07-08 (×3): qty 1

## 2021-07-08 MED ORDER — ONDANSETRON HCL 4 MG PO TABS: 4.0000 mg | ORAL_TABLET | Freq: Four times a day (QID) | ORAL | Status: DC | PRN

## 2021-07-08 MED ORDER — ONDANSETRON HCL 4 MG/2ML IJ SOLN
4.0000 mg | Freq: Four times a day (QID) | INTRAMUSCULAR | Status: DC | PRN
Start: 2021-07-08 — End: 2021-07-11

## 2021-07-08 NOTE — ED Triage Notes (Addendum)
C/o upper abd pain that radiates into his chest. Describes as sharp and burning. C/o feeling a little sob. C/o nausea and vomiting times once prior to arrival. Currently nauseated. States he took pepto and Rolaids without relief. Pt vomited times one approx in triage. Dark in color.

## 2021-07-08 NOTE — ED Provider Notes (Signed)
[facility]   CSN: [id number] Arrival date & time: 07/08/21  0136     History  Chief Complaint  Patient presents with   Abdominal Pain    Angel Lawson is a 66 y.o. male.  The history is provided by the patient.  Abdominal Pain Pain location:  Epigastric Pain quality: burning   Pain radiates to:  Chest Pain severity:  Severe Timing:  Constant Progression:  Waxing and waning Chronicity:  New Context: not alcohol use   Relieved by:  Nothing Associated symptoms: constipation, nausea and vomiting   Associated symptoms: no diarrhea, no hematemesis, no hematochezia and no melena   Risk factors: no alcohol abuse, has not had multiple surgeries and no NSAID use   Patient with history of anxiety and hyperlipidemia presents with abdominal pain.  Patient reports onset of upper abdominal pain that feels like it is burning and into his chest.  Reports he feels like he has reflux.  He has taken OTC meds without relief.  He vomited earlier in the night without any bloody emesis Patient denies any excessive NSAID use or alcohol use   Past Medical History:  Diagnosis Date   Anxiety    High cholesterol     Home Medications Prior to Admission medications   Medication Sig Start Date End Date Taking? Authorizing Provider  ALPRAZolam Prudy Feeler(XANAX) 0.5 MG tablet Take 0.5 mg by mouth daily as needed for anxiety (stress). 12/27/20   [provider]  aspirin EC 81 MG tablet Take 81 mg by mouth every evening. Swallow whole.    [provider]  buPROPion (WELLBUTRIN XL) 300 MG 24 hr tablet Take 300 mg by mouth every morning. 03/05/21   [provider]  fluticasone (FLONASE) 50 MCG/ACT nasal spray Place 1 spray into both nostrils daily as needed (seasonal allergies).    [provider]  ibuprofen (ADVIL) 200 MG tablet Take 400-600 mg by mouth daily as needed for headache (pain).    [provider]  Multiple Vitamin  (MULTIVITAMIN WITH MINERALS) TABS tablet Take 1 tablet by mouth every evening.    [provider]  rosuvastatin (CRESTOR) 20 MG tablet Take 20 mg by mouth at bedtime. 04/09/21   [provider]  tadalafil (CIALIS) 5 MG tablet Take 5 mg by mouth every morning. 03/26/21   [provider]  testosterone cypionate (DEPOTESTOSTERONE CYPIONATE) 200 MG/ML injection Inject 200 mg into the muscle every 21 ( twenty-one) days. Administered by Dr. Antony HasteMichael Badger 12/14/20   [provider]      Allergies    Patient has no known allergies.    Review of Systems   Review of Systems  Gastrointestinal:  Positive for abdominal pain, constipation, nausea and vomiting. Negative for diarrhea, hematemesis, hematochezia and melena.   Physical Exam Updated Vital Signs BP 126/82   Pulse 85   Temp 98.5 F (36.9 C) (Oral)   Resp 19   Ht 1.753 m (5\' 9" )   Wt 74.8 kg   SpO2 90%   BMI 24.37 kg/m  Physical Exam CONSTITUTIONAL: Well developed/well nourished HEAD: Normocephalic/atraumatic EYES: EOMI/PERRL, no icterus ENMT: Mucous membranes moist NECK: supple no meningeal signs SPINE/BACK:entire spine nontender CV: S1/S2 noted, no murmurs/rubs/gallops noted LUNGS: Lungs are clear to auscultation bilaterally, no apparent distress ABDOMEN: soft, moderate epigastric tenderness, no rebound or guarding, bowel sounds noted throughout abdomen GU:no cva tenderness NEURO: Pt is awake/alert/appropriate, moves all extremitiesx4.  No facial droop.   EXTREMITIES: pulses normal/equal, full  ROM SKIN: warm, color normal PSYCH: no abnormalities of mood noted, alert and oriented to situation  ED Results / Procedures / Treatments   Labs (all labs ordered are listed, but only abnormal results are displayed) Labs Reviewed  LIPASE, BLOOD - Abnormal; Notable for the following components:      Result Value   Lipase >10,000 (*)    All other components within normal limits  COMPREHENSIVE  METABOLIC PANEL - Abnormal; Notable for the following components:   Glucose, Bld 121 (*)    AST 226 (*)    ALT 217 (*)    Total Bilirubin 4.3 (*)    All other components within normal limits  CBC  TROPONIN I (HIGH SENSITIVITY)    EKG EKG Interpretation  Date/Time:  Sunday Jul 08 2021 01:46:26 EDT Ventricular Rate:  97 PR Interval:  140 QRS Duration: 142 QT Interval:  382 QTC Calculation: 485 R Axis:   -60 Text Interpretation: Normal sinus rhythm Possible Left atrial enlargement Right bundle branch block Left anterior fascicular block  Bifascicular block  Abnormal ECG When compared with ECG of 25-Apr-2021 11:15, No significant change was found Confirmed by Zadie Rhine ([id number]) on 07/08/2021 1:50:27 AM  Radiology CT ABDOMEN PELVIS W CONTRAST  Result Date: 07/08/2021 CLINICAL DATA:  66 year old male with nausea vomiting, abdominal pain, pancreatic cystic mass in March. EXAM: CT ABDOMEN AND PELVIS WITH CONTRAST TECHNIQUE: Multidetector CT imaging of the abdomen and pelvis was performed using the standard protocol following bolus administration of intravenous contrast. RADIATION DOSE REDUCTION: This exam was performed according to the departmental dose-optimization program which includes automated exposure control, adjustment of the mA and/or kV according to patient size and/or use of iterative reconstruction technique. CONTRAST:  OMNIPAQUE IOHEXOL 300 MG/ML  SOLN COMPARISON:  CT chest, abdomen and Pelvis 04/25/2021. FINDINGS: Lower chest: Mild lung base atelectasis and/or scarring has not significantly changed. Cardiac size at the upper limits of normal. No pericardial or pleural effusion. Hepatobiliary: Gallbladder more distended today but otherwise appears normal. CBD is larger, 8 mm now versus 5 mm in March. The CBD appears to taper distally. Small 2-3 mm calcification in the duodenum on coronal image 53, which could be at the and few low. Mild new central hepatic duct prominence.  Liver enhancement remains normal. Pancreas: Bilobed cystic mass of the pancreatic body measures about 16 x 20 x 45 mm (AP by transverse by CC) versus 16 x 11 x 28 mm in March. There is mild prominence of the main pancreatic duct. No pancreatic inflammation or interval atrophy identified. No peripancreatic lymphadenopathy identified. Spleen: Negative. Adrenals/Urinary Tract: Stable and essentially negative. Small benign appearing renal cysts (no follow-up imaging recommended). Stomach/Bowel: Redundant sigmoid colon tracks into the epigastrium. Gas and retained stool in the large bowel. Severe diverticulosis in the descending colon and at the junction with the sigmoid. No active inflammation identified. Normal appendix on series 2, image 100. No dilated small bowel. Cecum appears to beyond a lax mesentery. Fluid-filled stomach and proximal duodenum today. But no convincing gastroduodenal inflammation. No free air or free fluid identified. Vascular/Lymphatic: Aortoiliac calcified atherosclerosis. Normal caliber abdominal aorta. Major arterial structures remain patent. Portal venous system is patent. No lymphadenopathy identified. Reproductive: Negative. Other: No pelvic free fluid. Musculoskeletal: Widespread lumbar vacuum disc and endplate spurring. No acute osseous abnormality identified. IMPRESSION: 1. Larger bilobed cystic mass of the pancreatic body since March, now up to 4.5 cm long axis (versus 3 cm previously). This is suspicious for a cystic Pancreatic Neoplasm,  and there is associated mild prominence of the main pancreatic duct. Recommend further evaluation. No acute pancreatic inflammation or interval atrophy. 2. New dilatation of the CBD, with possible obstructing 2-3 mm stone at the ampulla. Query hyperbilirubinemia. 3. No other acute or inflammatory process identified in the abdomen or pelvis. Severe diverticulosis of the descending and sigmoid colon without active inflammation. Aortic Atherosclerosis  (ICD10-I70.0). Electronically Signed   By: Odessa Fleming M.D.   On: 07/08/2021 04:55    Procedures Procedures    Medications Ordered in ED Medications  ondansetron (ZOFRAN) injection 4 mg (4 mg Intravenous Given 07/08/21 0210)  pantoprazole (PROTONIX) injection 40 mg (40 mg Intravenous Given 07/08/21 0326)  alum & mag hydroxide-simeth (MAALOX/MYLANTA) 200-200-20 MG/5ML suspension 30 mL (30 mLs Oral Given 07/08/21 0326)    And  lidocaine (XYLOCAINE) 2 % viscous mouth solution 15 mL (15 mLs Oral Given 07/08/21 0327)  sodium chloride 0.9 % bolus 1,000 mL (0 mLs Intravenous Stopped 07/08/21 0521)  iohexol (OMNIPAQUE) 300 MG/ML solution 100 mL (100 mLs Intravenous Contrast Given 07/08/21 0401)    ED Course/ Medical Decision Making/ A&P Clinical Course as of 07/08/21 0553  Sun Jul 08, 2021  0354 Patient reports upper abdominal pain with burning pain into his chest.  Previous records reviewed reveal he has a history of a complex pancreatic cyst that has been getting monitored as an outpatient.  However his LFTs are significantly deranged from baseline.  We will proceed with CT abdomen pelvis [DW]  0551 Abnormal findings noted on CT scan.  Patient with large pancreatic mass but also may have an obstructive process in the CBD [DW]  [phone] Patient currently pain-free.  We discussed findings and informed that he may have neoplasm. [DW]  9604 MRI abdomen from April in the [facility] system reveals large complex pancreatic cyst.  He will need to undergo further testing with gastroenterology. [DW]  [phone] Patient without fever, no leukocytosis and without pain at this time.  Will defer antibiotics.  Consult to Dr. Margo Aye for admission.  Epic message sent to on-call gastroenterologist  (outlaw) [DW]    Clinical Course User Index [DW] Zadie Rhine, MD                           Medical Decision Making Amount and/or Complexity of Data Reviewed Labs: ordered. Radiology: ordered.  Risk OTC drugs. Prescription drug  management. Decision regarding hospitalization.   This patient presents to the ED for concern of abdominal pain, this involves an extensive number of treatment options, and is a complaint that carries with it a high risk of complications and morbidity.  The differential diagnosis includes but is not limited to acute coronary syndrome, pancreatitis, cholecystitis, cholelithiasis, bowel obstruction, bowel perforation  Comorbidities that complicate the patient evaluation: Patient's presentation is complicated by their history of complex pancreatic cyst  Social Determinants of Health: Patient's  previous marijuana use   increases the complexity of managing their presentation  Additional history obtained:  Records reviewed Care Everywhere/External Records  Lab Tests: I Ordered, and personally interpreted labs.  The pertinent results include: Transaminitis, pancreatitis  Imaging Studies ordered: I ordered imaging studies including CT scan abdomen pelvis   I independently visualized and interpreted imaging which showed pancreatic mass, CBD dilation and obstructive process I agree with the radiologist interpretation  Cardiac Monitoring: The patient was maintained on a cardiac monitor.  I personally viewed and interpreted the cardiac monitor which showed an underlying rhythm of:  sinus rhythm  Medicines ordered and prescription drug management: I ordered medication including Zofran and Protonix for nausea and pain Reevaluation of the patient after these medicines showed that the patient    improved   Consultations Obtained: I requested consultation with the admitting physician Dr. Margo Aye , and discussed  findings as well as pertinent plan - they recommend: Admission  Reevaluation: After the interventions noted above, I reevaluated the patient and found that they have :improved  Complexity of problems addressed: Patient's presentation is most consistent with  acute presentation with  potential threat to life or bodily function  Disposition: After consideration of the diagnostic results and the patient's response to treatment,  I feel that the patent would benefit from admission   .           Final Clinical Impression(s) / ED Diagnoses Final diagnoses:  Pancreatic mass  Choledocholithiasis    Rx / DC Orders ED Discharge Orders     None         Zadie Rhine, MD 07/08/21 [phone]

## 2021-07-08 NOTE — H&P (Signed)
History and Physical    Patient: Angel Lawson [id number] DOB: August 02, 1955 DOA: 07/08/2021 DOS: the patient was seen and examined on 07/08/2021 PCP: Chesley Noon, MD  Patient coming from: Home  Chief Complaint:  Chief Complaint  Patient presents with   Abdominal Pain   HPI: VALDIS OBENSHAIN is a 66 y.o. male with medical history significant of anxiety, hyperlipidemia, diagnosed with a pancreatic cyst in early March this year on CTA abdomen who is coming to the emergency department with complaints of severe upper abdominal pain which radiates to his chest, burning in nature associated with mild dyspnea, nausea and multiple emesis episodes not relieved by Pepto-Bismol or calcium carbonate.  No flank pain, diarrhea, constipation, melena or hematochezia.  No dysuria, frequency or hematuria.  No fever, chills, rhinorrhea, sore throat, chest pain, PND, orthopnea or pitting edema lower extremities.  He was mildly dyspneic with palpitations when the pain was very intense.  No polyuria, polydipsia, polyphagia or blurred vision.  ED course: Initial vital signs were temperature 98.5 F, pulse 74, respiration 20, BP 135/88 mmHg and O2 sat 98% on room air.  The patient received ondansetron 4 mg IVP Maalox +2% viscous lidocaine and 1000 mL of normal saline bolus.  Lab work: CBC is her white count of 8.9, hemoglobin 16.8 g/dL platelets 276.  Lipase was more than 10,000 units/L.  Troponin was normal.  CMP showed normal electrolytes and renal function.  Bilirubin 4.3 and glucose 121 mg/dL.  AST 226 and ALT 217 units/L.  Albumin, total protein and alkaline phosphatase were negative.  Imaging: CT abdomen/pelvis with contrast larger bilobed 16 mass of the pancreatic body since March that is measuring now 4.5 centimeters versus 3 cm previously.  There was new dilatation of the CBD with possible obstructing 2 to 3 mm stone at the ampulla.  This is suspicious for a cystic pancreatic neoplasm, and there is  associated mild prominence of the main pancreatic duct.  No acute pancreatic inflammation or interval atrophy.  No other acute inflammatory process identified in the abdomen or pelvis.  Aortic atherosclerosis.   Review of Systems: As mentioned in the history of present illness. All other systems reviewed and are negative. Past Medical History:  Diagnosis Date   Anxiety    High cholesterol    History reviewed. No pertinent surgical history. Social History:  reports that he has never smoked. He has never used smokeless tobacco. He reports that he does not drink alcohol and does not use drugs.  No Known Allergies  History reviewed. No pertinent family history.  Prior to Admission medications   Medication Sig Start Date End Date Taking? Authorizing Provider  ALPRAZolam Duanne Moron) 0.5 MG tablet Take 0.5 mg by mouth daily as needed for anxiety (stress). 12/27/20   [provider]  aspirin EC 81 MG tablet Take 81 mg by mouth every evening. Swallow whole.    [provider]  buPROPion (WELLBUTRIN XL) 300 MG 24 hr tablet Take 300 mg by mouth every morning. 03/05/21   [provider]  fluticasone (FLONASE) 50 MCG/ACT nasal spray Place 1 spray into both nostrils daily as needed (seasonal allergies).    [provider]  ibuprofen (ADVIL) 200 MG tablet Take 400-600 mg by mouth daily as needed for headache (pain).    [provider]  Multiple Vitamin (MULTIVITAMIN WITH MINERALS) TABS tablet Take 1 tablet by mouth every evening.    [provider]  rosuvastatin (CRESTOR) 20 MG tablet Take 20 mg by  mouth at bedtime. 04/09/21   [provider]  tadalafil (CIALIS) 5 MG tablet Take 5 mg by mouth every morning. 03/26/21   [provider]  testosterone cypionate (DEPOTESTOSTERONE CYPIONATE) 200 MG/ML injection Inject 200 mg into the muscle every 21 ( twenty-one) days. Administered by Dr. Anastasia Pall 12/14/20   [provider]     Physical Exam: Vitals:   07/08/21 0700 07/08/21 0800 07/08/21 0934 07/08/21 1007  BP: 117/77 110/75 121/85 (!) 143/85  Pulse: 79 64 75 80  Resp: 18 17 20 14   Temp:   98.9 F (37.2 C) 98.2 F (36.8 C)  TempSrc:   Oral Oral  SpO2: 92% 94% 94% 96%  Weight:    74.9 kg  Height:    5\' 9"  (1.753 m)   Physical Exam Vitals and nursing note reviewed.  Constitutional:      Appearance: He is normal weight.  HENT:     Head: Normocephalic and atraumatic.     Mouth/Throat:     Mouth: Mucous membranes are moist.  Eyes:     General: No scleral icterus.    Pupils: Pupils are equal, round, and reactive to light.  Neck:     Vascular: No JVD.  Cardiovascular:     Rate and Rhythm: Normal rate and regular rhythm.     Heart sounds: S1 normal and S2 normal.  Pulmonary:     Effort: Pulmonary effort is normal.     Breath sounds: Normal breath sounds. No wheezing, rhonchi or rales.  Abdominal:     General: Bowel sounds are normal.     Palpations: Abdomen is soft.     Tenderness: There is no abdominal tenderness. There is no right CVA tenderness or left CVA tenderness.  Musculoskeletal:     Cervical back: Neck supple.     Right lower leg: No edema.     Left lower leg: No edema.  Skin:    General: Skin is warm and dry.  Neurological:     General: No focal deficit present.     Mental Status: He is alert and oriented to person, place, and time.  Psychiatric:        Mood and Affect: Mood normal.        Behavior: Behavior normal.    Data Reviewed:  There are no new results to review at this time.  Assessment and Plan: Principal Problem:    Abnormal LFTs    Elevated lipase In the presence of   Pancreatic cystic mass Admit to PCU/inpatient. Clear liquid diet.   Continue IV fluids. Analgesics as needed. Antiemetics as needed. Pantoprazole 40 mg IVP daily. Follow-up CBC, CMP and lipase.  Active Problems:   Hypercholesteremia Continue rosuvastatin 20 mg p.o. bedtime.     Generalized anxiety disorder Continue Wellbutrin 300 mg p.o. in the morning.     Advance Care Planning:   Code Status: Full Code   Consults: Gastroenterology Arta Silence and D).  Family Communication:  Severity of Illness: The appropriate patient status for this patient is INPATIENT. Inpatient status is judged to be reasonable and necessary in order to provide the required intensity of service to ensure the patient's safety. The patient's presenting symptoms, physical exam findings, and initial radiographic and laboratory data in the context of their chronic comorbidities is felt to place them at high risk for further clinical deterioration. Furthermore, it is not anticipated that the patient will be medically stable for discharge from the hospital within 2 midnights of admission.   *  I certify that at the point of admission it is my clinical judgment that the patient will require inpatient hospital care spanning beyond 2 midnights from the point of admission due to high intensity of service, high risk for further deterioration and high frequency of surveillance required.*  Author: Reubin Milan, MD 07/08/2021 10:57 AM  For on call review [email or web address].   This document was prefers in [employer] and may contain some unintended transcription errors.

## 2021-07-08 NOTE — ED Notes (Signed)
Patient transported to CT via w/c. 

## 2021-07-08 NOTE — ED Notes (Signed)
Pt now returned from CT dept via w/c - no acute changes

## 2021-07-08 NOTE — Consult Note (Signed)
[facility] Consultation Note  Referring Provider: [facility] Primary Care Physician:  Eartha Inch, MD  Reason for Consultation:  abdominal pain  HPI: Angel Lawson is a 66 y.o. male seen for evaluation of abdominal pain.  Static state of GI health until couple days ago.  At that time, he began having intermittent bouts of progressively worsening epigastric pain.  Some dark urine.  No fevers/chills.  Some nausea without vomiting.  No prior similar attacks of pain.  Had CT showing dilated bile duct and possible ampullary stone.  LFTs are elevated.  Rare alcohol.  Pain much better today.   Past Medical History:  Diagnosis Date   Anxiety    High cholesterol     History reviewed. No pertinent surgical history.  Prior to Admission medications   Medication Sig Start Date End Date Taking? Authorizing Provider  ALPRAZolam Prudy Feeler) 0.5 MG tablet Take 0.5 mg by mouth daily as needed for anxiety (stress). 12/27/20   [provider]  aspirin EC 81 MG tablet Take 81 mg by mouth every evening. Swallow whole.    [provider]  buPROPion (WELLBUTRIN XL) 300 MG 24 hr tablet Take 300 mg by mouth every morning. 03/05/21   [provider]  fluticasone (FLONASE) 50 MCG/ACT nasal spray Place 1 spray into both nostrils daily as needed (seasonal allergies).    [provider]  ibuprofen (ADVIL) 200 MG tablet Take 400-600 mg by mouth daily as needed for headache (pain).    [provider]  Multiple Vitamin (MULTIVITAMIN WITH MINERALS) TABS tablet Take 1 tablet by mouth every evening.    [provider]  rosuvastatin (CRESTOR) 20 MG tablet Take 20 mg by mouth at bedtime. 04/09/21   [provider]  tadalafil (CIALIS) 5 MG tablet Take 5 mg by mouth every morning. 03/26/21   [provider]  testosterone cypionate (DEPOTESTOSTERONE CYPIONATE) 200 MG/ML injection Inject 200 mg into the muscle every 21 ( twenty-one) days.  Administered by Dr. Antony Haste 12/14/20   [provider]    Current Facility-Administered Medications  Medication Dose Route Frequency Provider Last Rate Last Admin   0.9 %  sodium chloride infusion   Intravenous Continuous Bobette Mo, MD 150 mL/hr at 07/08/21 1029 New Bag at 07/08/21 1029   acetaminophen (TYLENOL) tablet 650 mg  650 mg Oral Q6H PRN Bobette Mo, MD       Or   acetaminophen (TYLENOL) suppository 650 mg  650 mg Rectal Q6H PRN Bobette Mo, MD       Chlorhexidine Gluconate Cloth 2 % PADS 6 each  6 each Topical Daily Bobette Mo, MD   6 each at 07/08/21 1020   fentaNYL (SUBLIMAZE) injection 100 mcg  100 mcg Intravenous Q2H PRN Bobette Mo, MD       HYDROmorphone (DILAUDID) injection 1 mg  1 mg Intravenous Q3H PRN Bobette Mo, MD       ondansetron [facility]) tablet 4 mg  4 mg Oral Q6H PRN Bobette Mo, MD       Or   ondansetron [facility]) injection 4 mg  4 mg Intravenous Q6H PRN Bobette Mo, MD        Allergies as of 07/08/2021   (No Known Allergies)    History reviewed. No pertinent family history.  Social History   Socioeconomic History   Marital status: Married    Spouse name: Not on file   Number of children: Not on file  Years of education: Not on file   Highest education level: Not on file  Occupational History   Not on file  Tobacco Use   Smoking status: Never   Smokeless tobacco: Never  Substance and Sexual Activity   Alcohol use: Never   Drug use: Never   Sexual activity: Not on file  Other Topics Concern   Not on file  Social History Narrative   Not on file   Social Determinants of Health   Financial Resource Strain: Not on file  Food Insecurity: Not on file  Transportation Needs: Not on file  Physical Activity: Not on file  Stress: Not on file  Social Connections: Not on file  Intimate Partner Violence: Not on file    Review of Systems: As per HPI, all others  negative  Physical Exam: Vital signs in last 24 hours: Temp:  [98.2 F (36.8 C)-98.9 F (37.2 C)] 98.2 F (36.8 C) (05/21 1007) Pulse Rate:  [64-104] 80 (05/21 1007) Resp:  [12-20] 14 (05/21 1007) BP: (110-143)/(75-88) 143/85 (05/21 1007) SpO2:  [90 %-98 %] 96 % (05/21 1007) Weight:  [74.8 kg-74.9 kg] 74.9 kg (05/21 1007)   General:   Alert,  Well-developed, well-nourished, pleasant and cooperative in NAD Head:  Normocephalic and atraumatic. Eyes:  Sclera icterus   Conjunctiva pink. Ears:  Normal auditory acuity. Nose:  No deformity, discharge,  or lesions. Mouth:  No deformity or lesions.  Oropharynx pink & moist. Neck:  Supple; no masses or thyromegaly. Abdomen:  Soft, mild epigastric tenderness without peritonitis, nondistended. No masses, hepatosplenomegaly or hernias noted. Normal bowel sounds, without guarding, and without rebound.     Msk:  Symmetrical without gross deformities. Normal posture. Pulses:  Normal pulses noted. Extremities:  Without clubbing or edema. Neurologic:  Alert and  oriented x4;  grossly normal neurologically. Skin:  Jaundiced, otherwise Intact without significant lesions or rashes. Psych:  Alert and cooperative. Normal mood and affect.   Lab Results: Recent Labs    07/08/21 0237  WBC 8.9  HGB 16.8  HCT 51.3  PLT 276   BMET Recent Labs    07/08/21 0237  NA 142  K 3.7  CL 104  CO2 25  GLUCOSE 121*  BUN 16  CREATININE 1.06  CALCIUM 9.5   LFT Recent Labs    07/08/21 0237  PROT 7.2  ALBUMIN 4.5  AST 226*  ALT 217*  ALKPHOS 74  BILITOT 4.3*   PT/INR No results for input(s): LABPROT, INR in the last 72 hours.  Studies/Results: CT ABDOMEN PELVIS W CONTRAST  Result Date: 07/08/2021 CLINICAL DATA:  66 year old male with nausea vomiting, abdominal pain, pancreatic cystic mass in March. EXAM: CT ABDOMEN AND PELVIS WITH CONTRAST TECHNIQUE: Multidetector CT imaging of the abdomen and pelvis was performed using the standard protocol  following bolus administration of intravenous contrast. RADIATION DOSE REDUCTION: This exam was performed according to the departmental dose-optimization program which includes automated exposure control, adjustment of the mA and/or kV according to patient size and/or use of iterative reconstruction technique. CONTRAST:  100mL OMNIPAQUE IOHEXOL 300 MG/ML  SOLN COMPARISON:  CT chest, abdomen and Pelvis 04/25/2021. FINDINGS: Lower chest: Mild lung base atelectasis and/or scarring has not significantly changed. Cardiac size at the upper limits of normal. No pericardial or pleural effusion. Hepatobiliary: Gallbladder more distended today but otherwise appears normal. CBD is larger, 8 mm now versus 5 mm in March. The CBD appears to taper distally. Small 2-3 mm calcification in the duodenum on coronal image 53,  which could be at the and few low. Mild new central hepatic duct prominence. Liver enhancement remains normal. Pancreas: Bilobed cystic mass of the pancreatic body measures about 16 x 20 x 45 mm (AP by transverse by CC) versus 16 x 11 x 28 mm in March. There is mild prominence of the main pancreatic duct. No pancreatic inflammation or interval atrophy identified. No peripancreatic lymphadenopathy identified. Spleen: Negative. Adrenals/Urinary Tract: Stable and essentially negative. Small benign appearing renal cysts (no follow-up imaging recommended). Stomach/Bowel: Redundant sigmoid colon tracks into the epigastrium. Gas and retained stool in the large bowel. Severe diverticulosis in the descending colon and at the junction with the sigmoid. No active inflammation identified. Normal appendix on series 2, image 100. No dilated small bowel. Cecum appears to beyond a lax mesentery. Fluid-filled stomach and proximal duodenum today. But no convincing gastroduodenal inflammation. No free air or free fluid identified. Vascular/Lymphatic: Aortoiliac calcified atherosclerosis. Normal caliber abdominal aorta. Major arterial  structures remain patent. Portal venous system is patent. No lymphadenopathy identified. Reproductive: Negative. Other: No pelvic free fluid. Musculoskeletal: Widespread lumbar vacuum disc and endplate spurring. No acute osseous abnormality identified. IMPRESSION: 1. Larger bilobed cystic mass of the pancreatic body since March, now up to 4.5 cm long axis (versus 3 cm previously). This is suspicious for a cystic Pancreatic Neoplasm, and there is associated mild prominence of the main pancreatic duct. Recommend further evaluation. No acute pancreatic inflammation or interval atrophy. 2. New dilatation of the CBD, with possible obstructing 2-3 mm stone at the ampulla. Query hyperbilirubinemia. 3. No other acute or inflammatory process identified in the abdomen or pelvis. Severe diverticulosis of the descending and sigmoid colon without active inflammation. Aortic Atherosclerosis (ICD10-I70.0). Electronically Signed   By: Odessa Fleming M.D.   On: 07/08/2021 04:55    Assessment:   Acute pancreatitis, suspect gallstone-mediated.  LFTs elevated.  No alcohol.  CBD 8 mm. Abnormal CT abd/pelvis, calcification at ampulla, query stone. Pancreatic cystic mass.  MRI done [facility] system in April showed ~ 2 x 3 cm bilobed cystic mass pancreas without worrisome features and six-month repeat MRI for surveillance advised.  This cyst is in the pancreatic body, nowhere near the ampulla or biliary tree.  Plan:   Clear liquid diet. Medical management pancreatitis. Follow LFTs. Check U/S abd RUQ. [facility] will revisit tomorrow.  Pending labs and clinical status, tomorrow could consider surgical consult for chole + IOC versus MRCP versus ERCP.   LOS: 0 days   Emaly Boschert M  07/08/2021, 11:56 AM  Cell [phone] If no answer or after 5 PM call [phone]

## 2021-07-09 ENCOUNTER — Inpatient Hospital Stay (HOSPITAL_COMMUNITY): Payer: Medicare Other

## 2021-07-09 ENCOUNTER — Encounter (HOSPITAL_COMMUNITY): Payer: Self-pay | Admitting: Internal Medicine

## 2021-07-09 DIAGNOSIS — K8511 Biliary acute pancreatitis with uninfected necrosis: Secondary | ICD-10-CM

## 2021-07-09 DIAGNOSIS — R748 Abnormal levels of other serum enzymes: Secondary | ICD-10-CM | POA: Diagnosis not present

## 2021-07-09 DIAGNOSIS — K862 Cyst of pancreas: Secondary | ICD-10-CM

## 2021-07-09 DIAGNOSIS — R7989 Other specified abnormal findings of blood chemistry: Secondary | ICD-10-CM | POA: Diagnosis not present

## 2021-07-09 DIAGNOSIS — E7800 Pure hypercholesterolemia, unspecified: Secondary | ICD-10-CM

## 2021-07-09 DIAGNOSIS — F411 Generalized anxiety disorder: Secondary | ICD-10-CM

## 2021-07-09 DIAGNOSIS — K8050 Calculus of bile duct without cholangitis or cholecystitis without obstruction: Secondary | ICD-10-CM | POA: Diagnosis not present

## 2021-07-09 DIAGNOSIS — K824 Cholesterolosis of gallbladder: Secondary | ICD-10-CM | POA: Diagnosis present

## 2021-07-09 LAB — COMPREHENSIVE METABOLIC PANEL
ALT: 131 U/L — ABNORMAL HIGH (ref 0–44)
AST: 80 U/L — ABNORMAL HIGH (ref 15–41)
Albumin: 3 g/dL — ABNORMAL LOW (ref 3.5–5.0)
Alkaline Phosphatase: 58 U/L (ref 38–126)
Anion gap: 5 (ref 5–15)
BUN: 9 mg/dL (ref 8–23)
CO2: 24 mmol/L (ref 22–32)
Calcium: 7.7 mg/dL — ABNORMAL LOW (ref 8.9–10.3)
Chloride: 112 mmol/L — ABNORMAL HIGH (ref 98–111)
Creatinine, Ser: 0.93 mg/dL (ref 0.61–1.24)
GFR, Estimated: >60 mL/min (ref >60)
Glucose, Bld: 89 mg/dL (ref 70–99)
Potassium: 3.7 mmol/L (ref 3.5–5.1)
Sodium: 141 mmol/L (ref 135–145)
Total Bilirubin: 1.2 mg/dL (ref 0.3–1.2)
Total Protein: 5.4 g/dL — ABNORMAL LOW (ref 6.5–8.1)

## 2021-07-09 LAB — CBC
HCT: 43.2 % (ref 39.0–52.0)
Hemoglobin: 14.3 g/dL (ref 13.0–17.0)
MCH: 30.8 pg (ref 26.0–34.0)
MCHC: 33.1 g/dL (ref 30.0–36.0)
MCV: 92.9 fL (ref 80.0–100.0)
Platelets: 206 10*3/uL (ref 150–400)
RBC: 4.65 MIL/uL (ref 4.22–5.81)
RDW: 13.9 % (ref 11.5–15.5)
WBC: 4.9 10*3/uL (ref 4.0–10.5)
nRBC: 0 % (ref 0.0–0.2)

## 2021-07-09 LAB — LIPASE, BLOOD: Lipase: 172 U/L — ABNORMAL HIGH (ref 11–51)

## 2021-07-09 LAB — HIV ANTIBODY (ROUTINE TESTING W REFLEX): HIV Screen 4th Generation wRfx: NONREACTIVE

## 2021-07-09 IMAGING — MR MR 3D RECON AT SCANNER
1 of 4 series · 8 of 48 positions shown · IV contrast (gadavist)
Comparison: CT AP [DATE].  Gallbladder sonogram [DATE].

CLINICAL DATA: Right upper quadrant pain. Enlarging cystic lesion
within the pancreas. Evaluate for gallstone pancreatitis or common
bile duct stone.

EXAM:
MRI ABDOMEN WITHOUT AND WITH CONTRAST (INCLUDING MRCP)
TECHNIQUE: Multiplanar multisequence MR imaging of the abdomen was performed
both before and after the administration of intravenous contrast.
Heavily T2-weighted images of the biliary and pancreatic ducts were
obtained, and three-dimensional MRCP images were rendered by post
processing.
CONTRAST:  7mL GADAVIST GADOBUTROL 1 MMOL/ML IV SOLN

[Series 14: [id number]_trig · coronal · 1.0mm · 0.49mm/px · 8 of 72 slices shown]
[im 4/72]
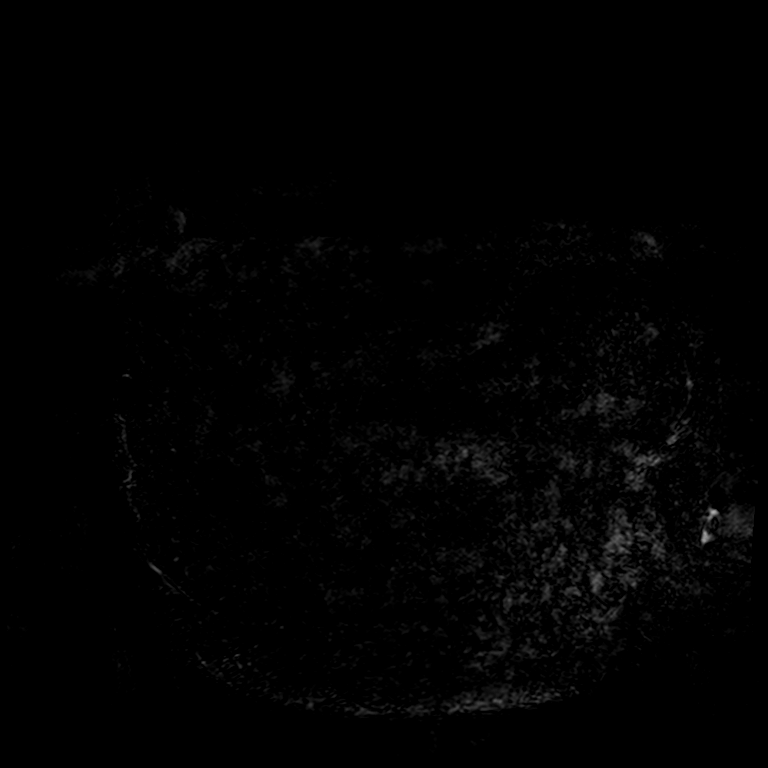
[im 13/72]
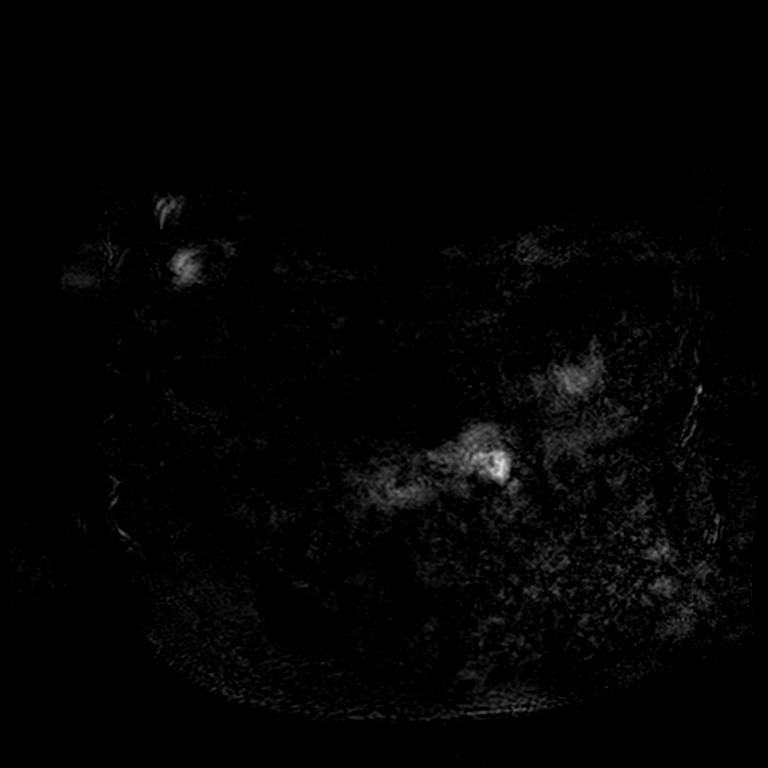
[im 22/72]
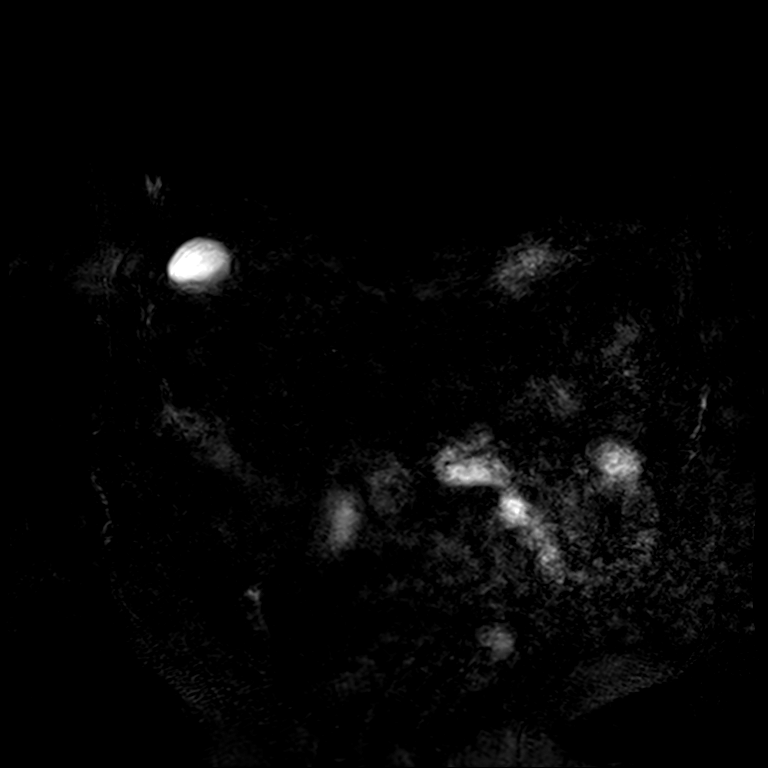
[im 31/72]
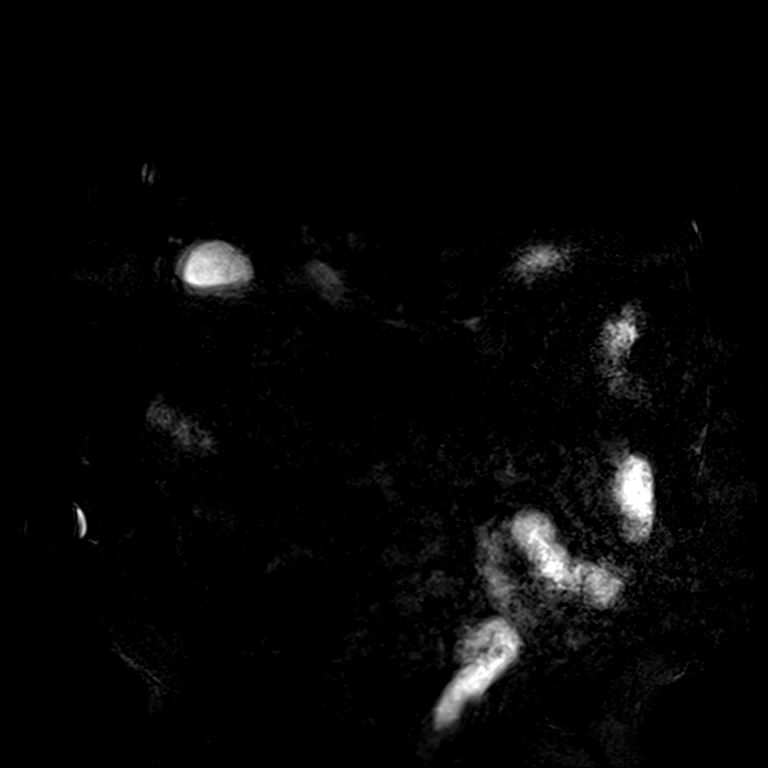
[im 38/72]
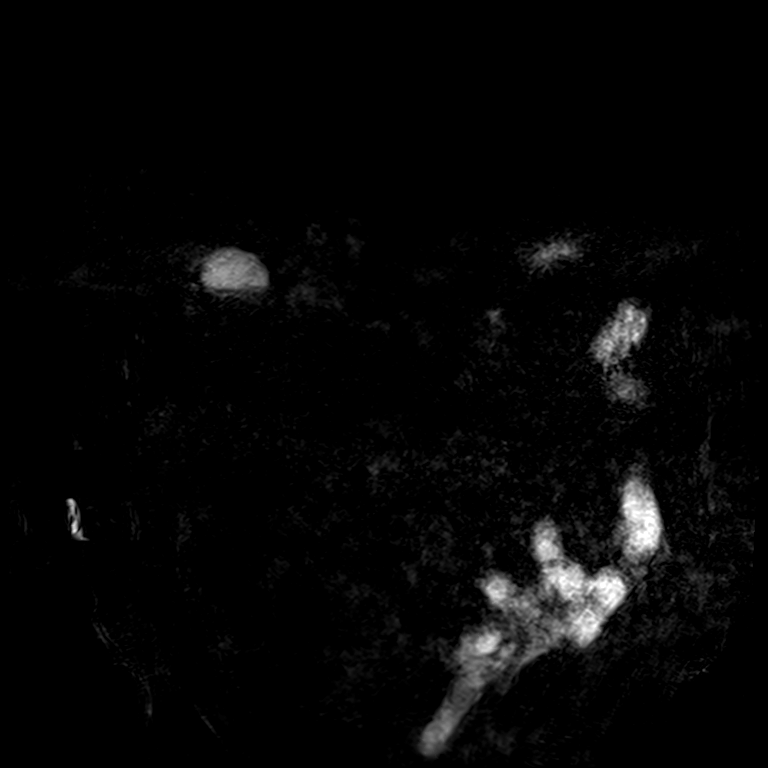
[im 41/72]
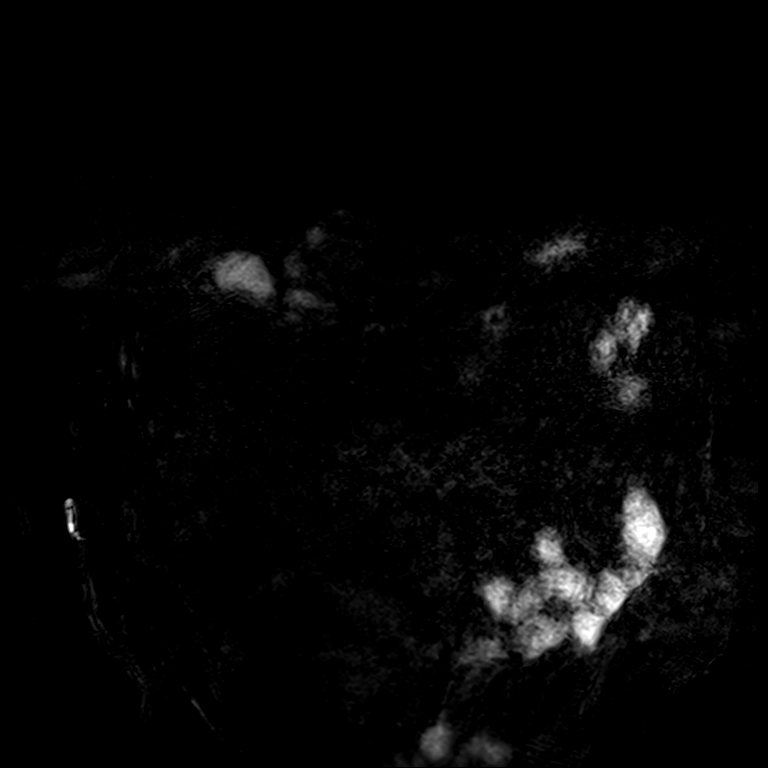
[im 50/72]
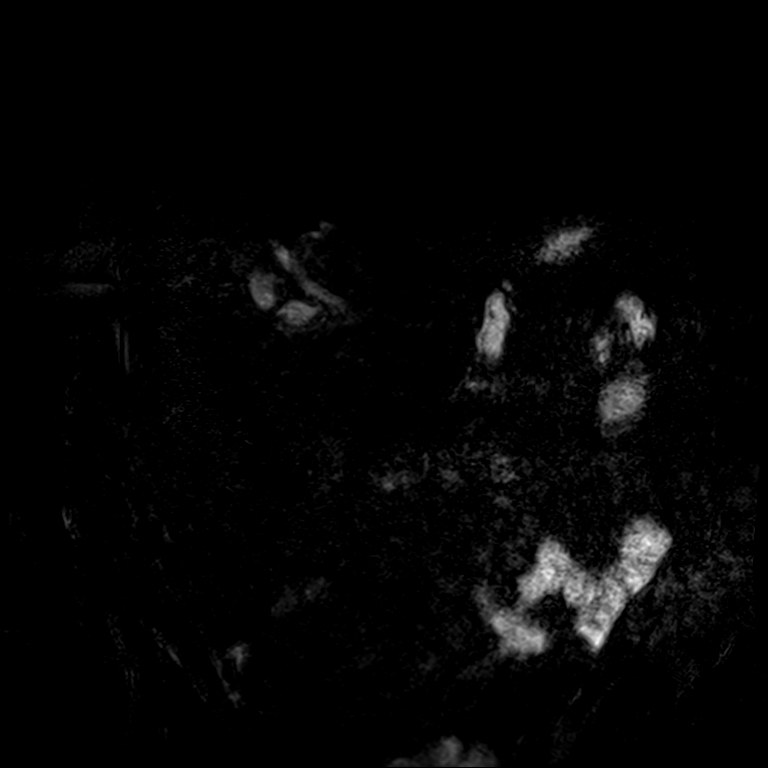
[im 62/72]
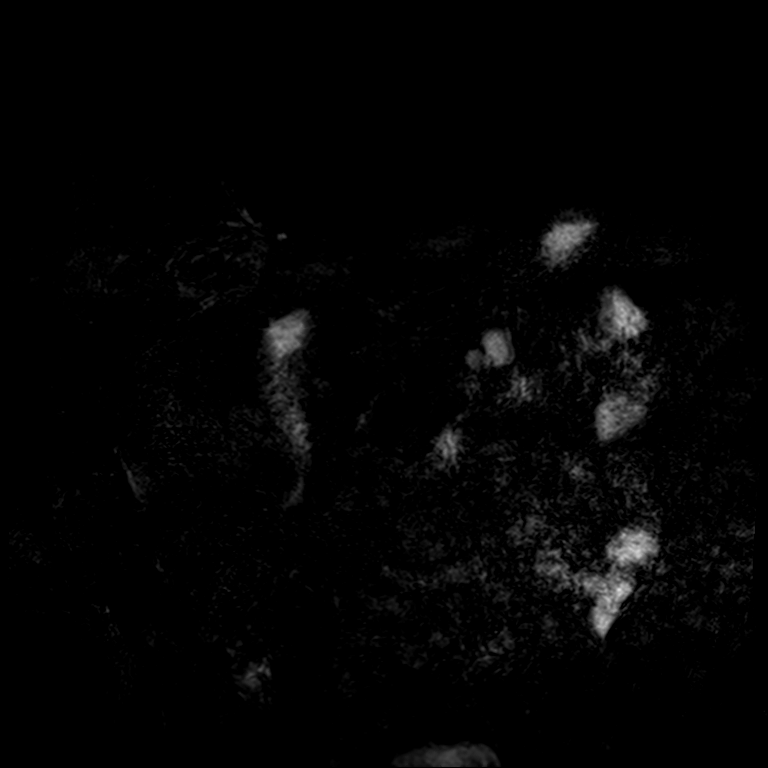

[8 of 48 positions shown; findings below may reference images not displayed]

FINDINGS: Lower chest: Trace right pleural fluid.

Hepatobiliary: Mild hepatic steatosis. No suspicious liver lesion
identified.

No gallstones identified. No gallbladder wall thickening or
pericholecystic inflammation. The 5 mm gallbladder polyp is better
seen on the ultrasound. No signs of bile duct dilatation. The common
bile duct measures 6 mm proximally and tapers normally to the level
of the ampulla. No sign choledocholithiasis.

Pancreas: No significant pancreatic edema. No signs of main duct
dilatation. Cystic lesion arising off the body of pancreas measures
2.1 x 1.4 by 3.1 cm, image [DATE] and image [DATE]. This contains 2 thin
internal areas septation. No enhancing mural nodule or thickened
irregular septation associated with this structure. This is not
significantly changed when compared with CT angio chest, abdomen and
pelvis from [DATE].

Spleen:  Within normal limits in size and appearance.

Adrenals/Urinary Tract: Normal adrenal glands. Bilateral benign
appearing Bosniak class 1 and 2 kidney cysts are noted measuring up
to 2.1 cm. No follow-up recommended. No signs of hydronephrosis.

Stomach/Bowel: Visualized portions within the abdomen are
unremarkable.

Vascular/Lymphatic: No pathologically enlarged lymph nodes
identified. No abdominal aortic aneurysm demonstrated.

Other:  No significant free fluid or fluid collections identified.

Musculoskeletal: No suspicious bone lesions identified.
IMPRESSION: 1. No gallstones or signs of choledocholithiasis. No significant
biliary ductal dilatation identified.
2. Mild hepatic steatosis
3. Cystic lesion within the body of pancreas has a maximum diameter
of 3.1 cm. No signs enhancing mural nodularity or thickened
enhancing septation. According to consensus criteria follow-up
imaging in 6 months with repeat pancreas protocol MRI without and
with contrast material is recommended. This recommendation follows
ACR consensus guidelines: Management of Incidental Pancreatic Cysts:
A White Paper of the ACR Incidental Findings Committee. [HOSPITAL] [XN];[DATE].

## 2021-07-09 IMAGING — US US ABDOMEN LIMITED
1 series · 15 of 25 positions shown · non-contrast
Comparison: CT [DATE].

CLINICAL DATA: Pancreatitis.

EXAM:
ULTRASOUND ABDOMEN LIMITED RIGHT UPPER QUADRANT

[Series 1: us abdomen limited ruq mc & wl · 15 of 47 slices shown]
[im 1/47]
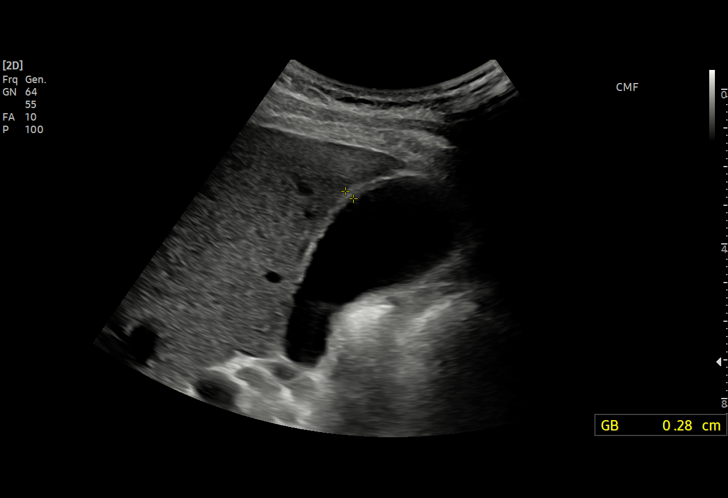
[im 4/47]
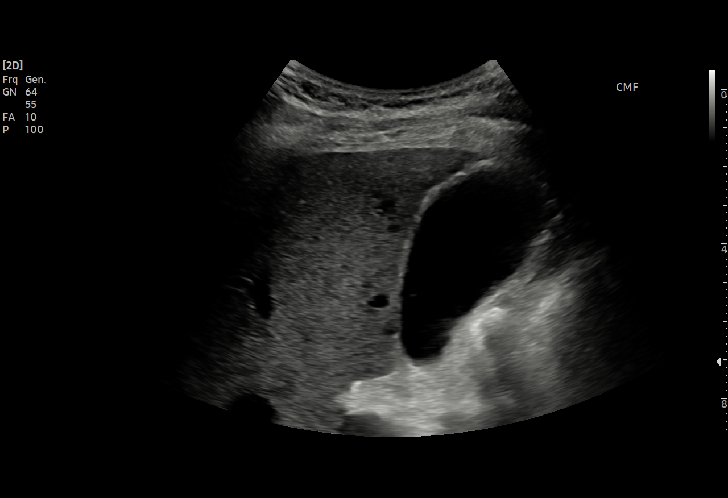
[im 8/47]
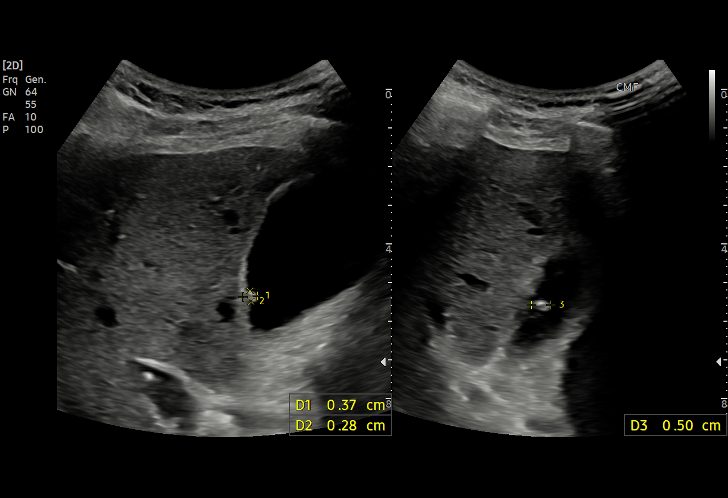
[im 10/47]
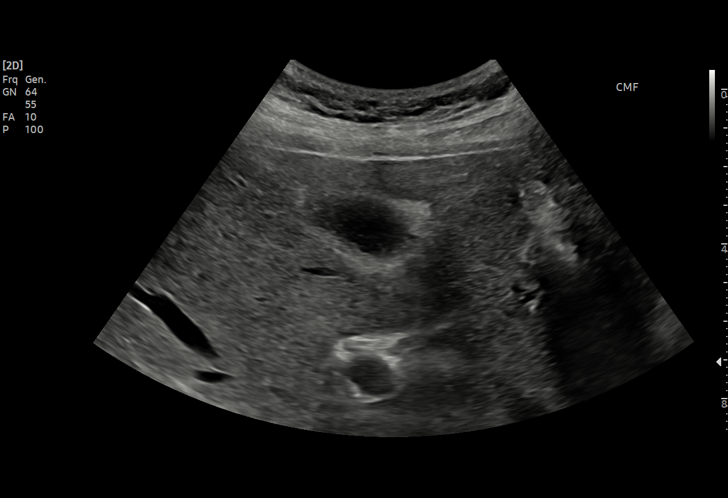
[im 14/47]
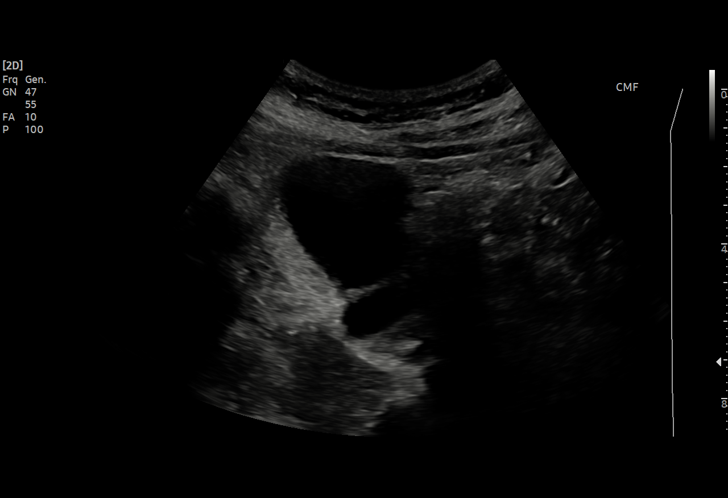
[im 18/47]
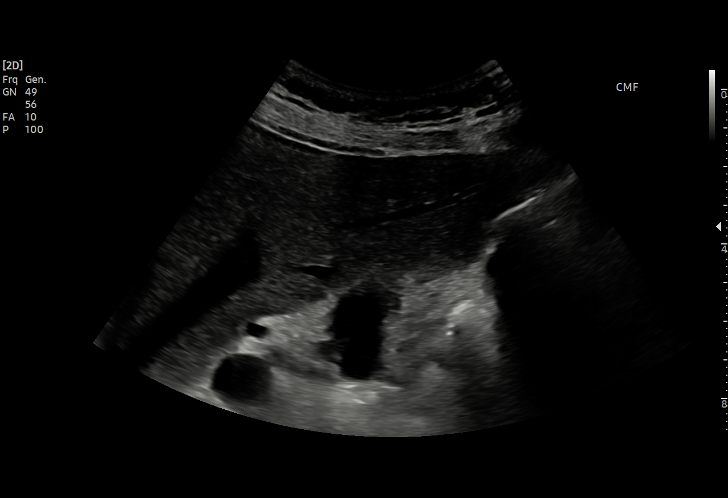
[im 20/47]
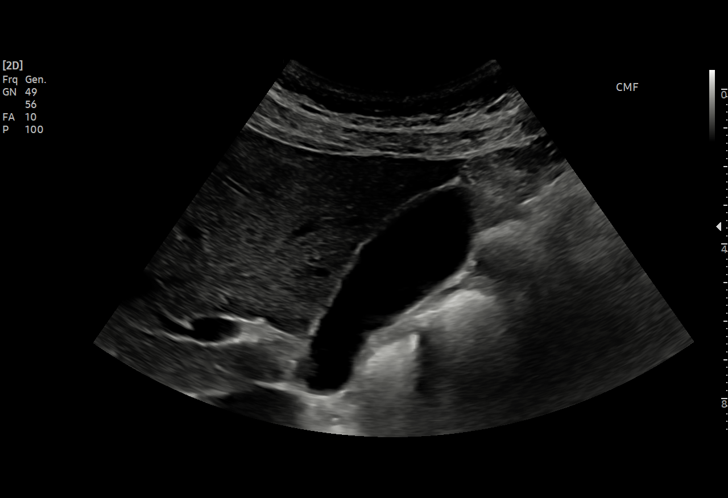
[im 24/47]
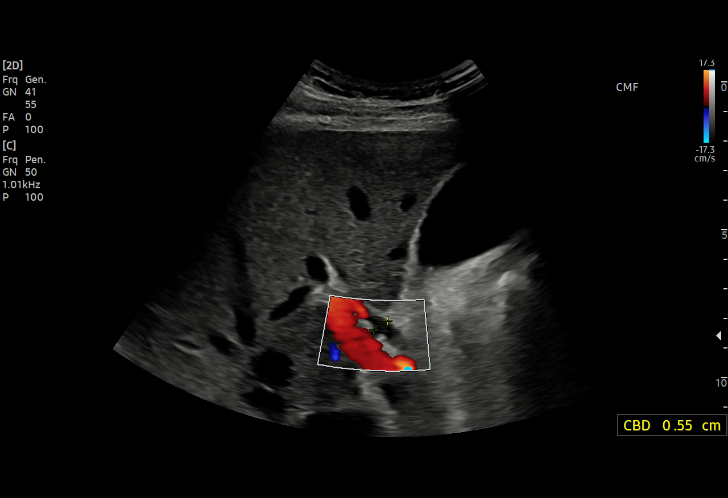
[im 27/47]
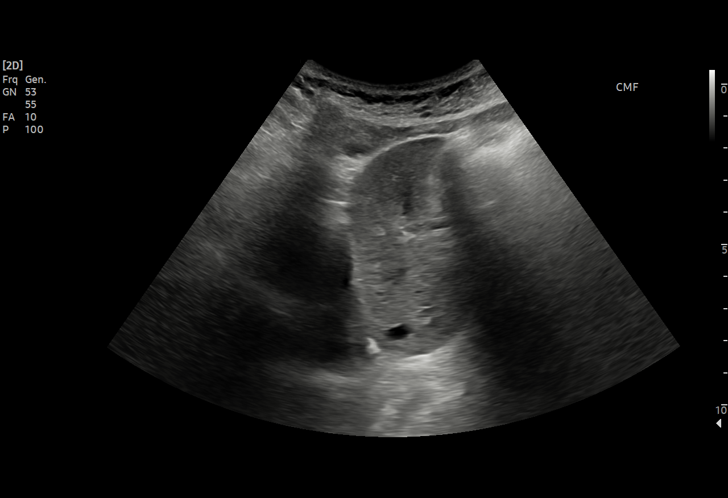
[im 29/47]
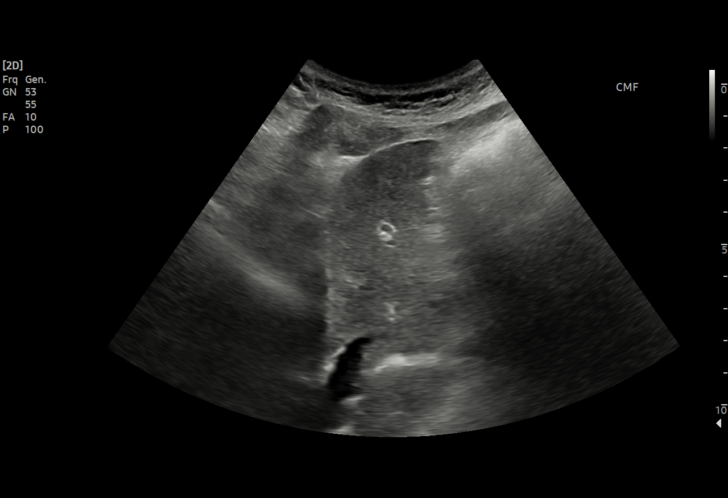
[im 33/47]
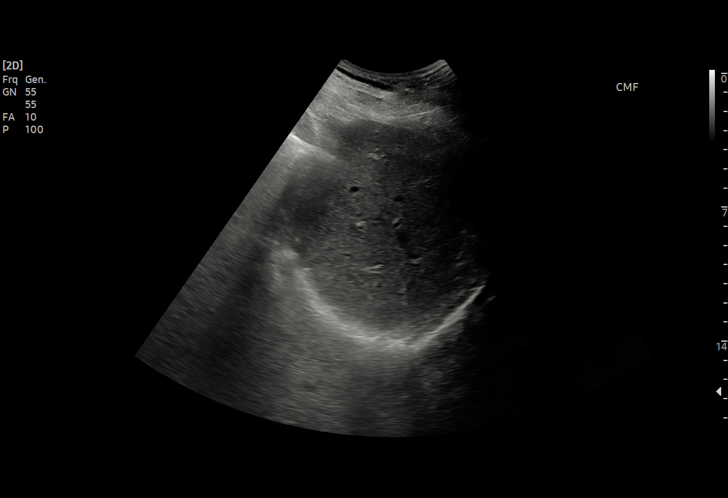
[im 37/47]
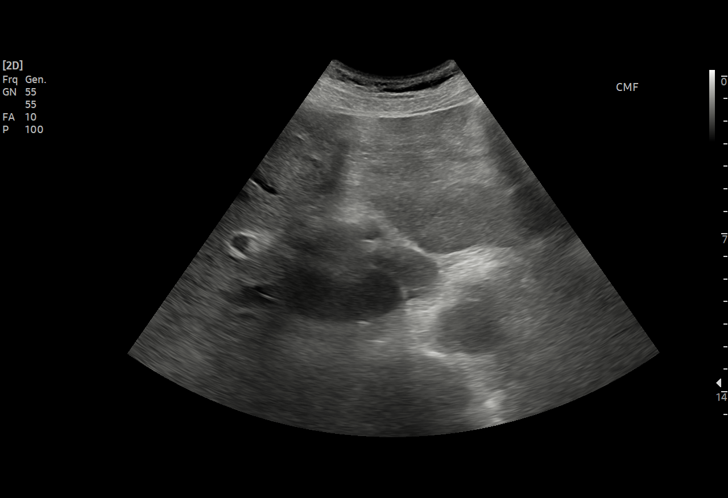
[im 39/47]
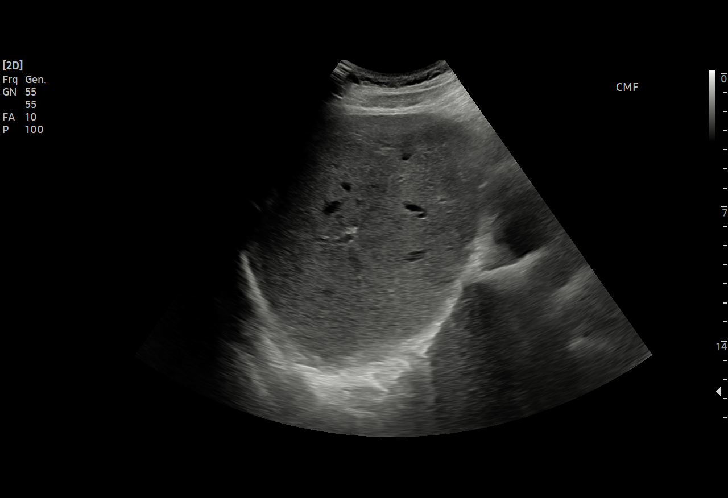
[im 43/47]
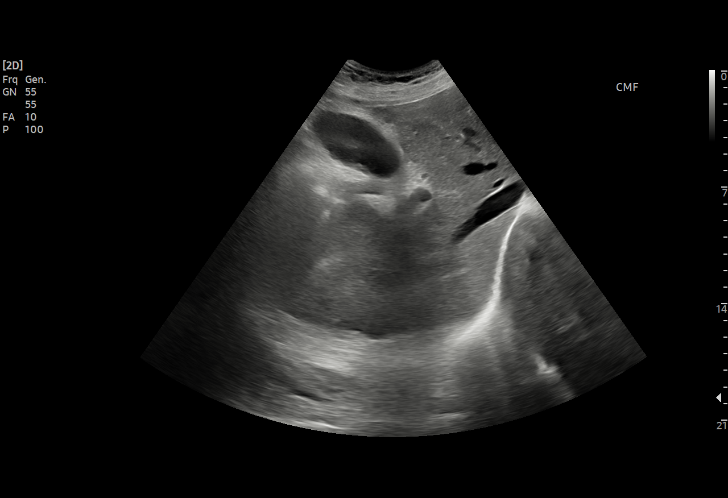
[im 47/47]
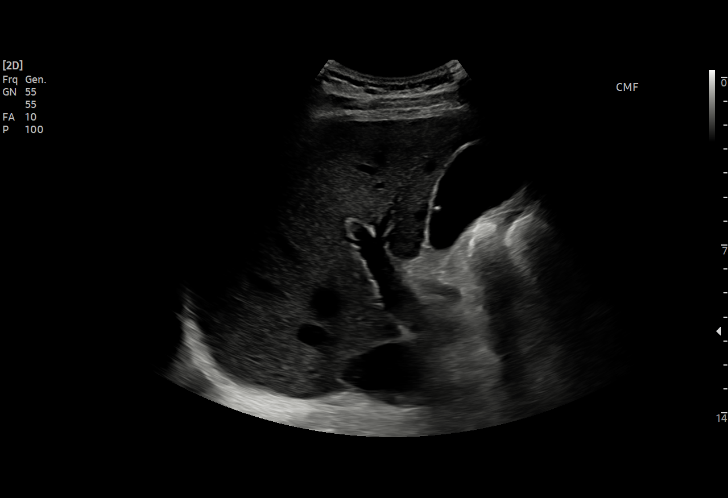

[15 of 25 positions shown; findings below may reference images not displayed]

FINDINGS: Gallbladder:

No gallstones or wall thickening visualized. No sonographic Murphy
sign noted by sonographer. 5 mm polyp is noted.

Common bile duct:

Diameter: 6 mm which is within normal limits.

Liver:

No focal lesion identified. Within normal limits in parenchymal
echogenicity. Portal vein is patent on color Doppler imaging with
normal direction of blood flow towards the liver.

Other: None.
IMPRESSION: Small gallbladder polyp is noted. No significant abnormality seen in
the right upper quadrant of the abdomen.

## 2021-07-09 IMAGING — MR MR ABDOMEN WO/W CM MRCP
20 series · 48 of 48 positions shown · IV contrast (7 ML GADAVIST)
Comparison: CT AP [DATE].  Gallbladder sonogram [DATE].

CLINICAL DATA: Right upper quadrant pain. Enlarging cystic lesion
within the pancreas. Evaluate for gallstone pancreatitis or common
bile duct stone.

EXAM:
MRI ABDOMEN WITHOUT AND WITH CONTRAST (INCLUDING MRCP)
TECHNIQUE: Multiplanar multisequence MR imaging of the abdomen was performed
both before and after the administration of intravenous contrast.
Heavily T2-weighted images of the biliary and pancreatic ducts were
obtained, and three-dimensional MRCP images were rendered by post
processing.
CONTRAST:  7mL GADAVIST GADOBUTROL 1 MMOL/ML IV SOLN

[Series 3: T2 · coronal · 7.0mm · 1.56mm/px · 1 of 27 slices shown (1 of 2)]
[im 1/27]
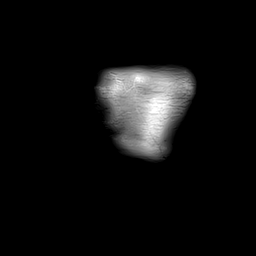

[Series 4: T2 fat-sat · axial · 6.0mm · 1.25mm/px · 1 of 36 slices shown]
[im 1/36]
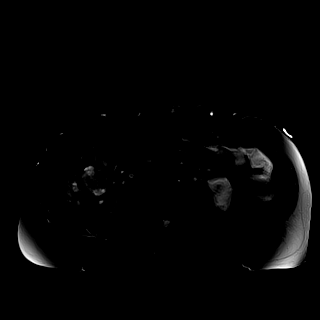

[Series 6: T1 · axial · 3.1mm · 1.25mm/px · z∈[-137,+108]mm · 2 of 80 slices shown (1 of 2)]
[im 1/80]
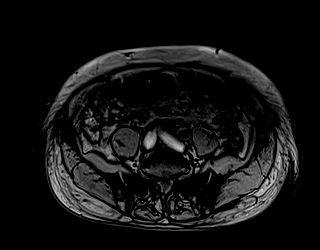
[im 80/80]
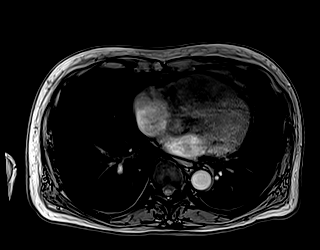

[Series 7: T1 · axial · 3.1mm · 1.25mm/px · z∈[-137,+108]mm · 3 of 80 slices shown (2 of 2)]
[im 1/80]
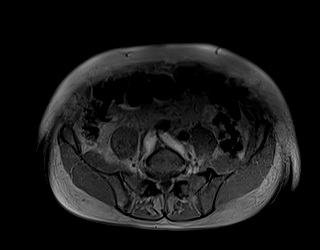
[im 40/80]
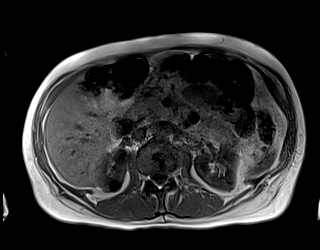
[im 80/80]
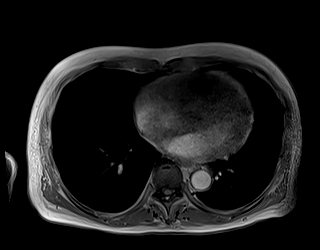

[Series 8: DWI · axial · 6.0mm · 1.49mm/px · z∈[-81,+171]mm · 2 of 72 slices shown (1 of 2)]
[im 1/72]
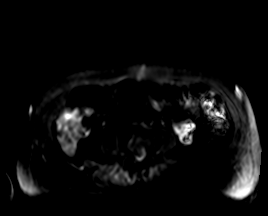
[im 72/72]
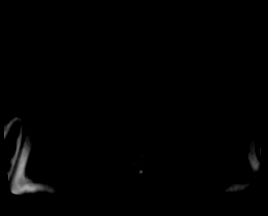

[Series 9: DWI · axial · 6.0mm · 1.49mm/px · 1 of 36 slices shown (2 of 2)]
[im 1/36]
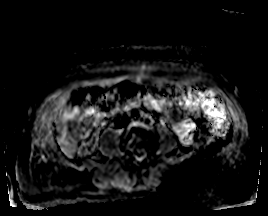

[Series 10: bSSFP · axial · 7.0mm · 1.25mm/px · 1 of 31 slices shown]
[im 1/31]
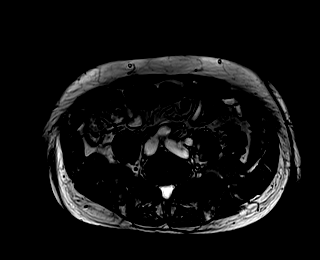

[Series 15: T1 dynamic · axial · 3.0mm · 1.25mm/px · z∈[-136,+125]mm · 3 of 88 slices shown (1 of 12)]
[im 1/88]
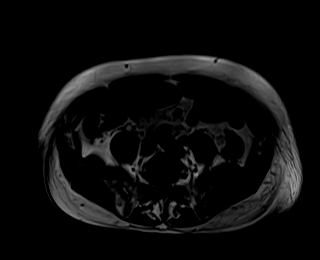
[im 44/88]
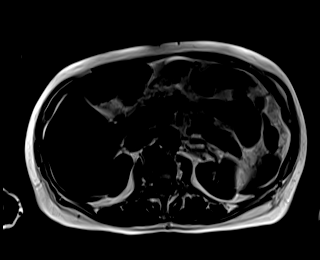
[im 88/88]
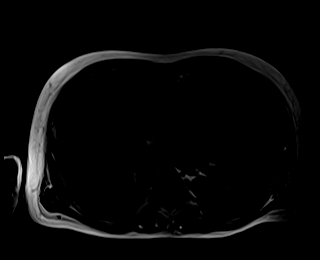

[Series 16: T1 dynamic · axial · 3.0mm · 1.25mm/px · z∈[-136,+125]mm · 3 of 88 slices shown (2 of 12)]
[im 1/88]
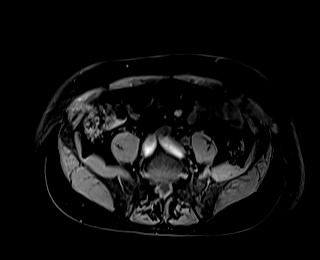
[im 44/88]
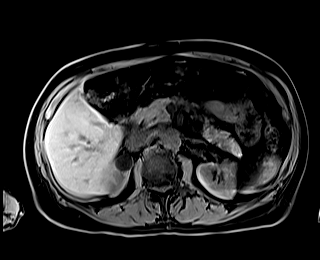
[im 88/88]
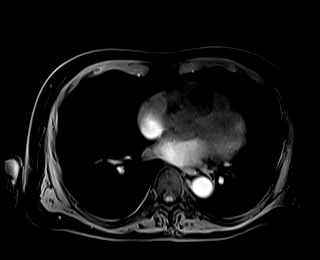

[Series 19: T1 dynamic · axial · 3.0mm · 1.25mm/px · z∈[-136,+125]mm · 3 of 88 slices shown (3 of 12)]
[im 1/88]
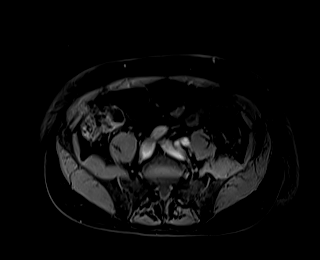
[im 44/88]
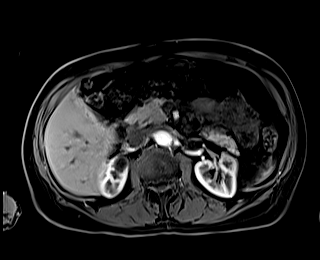
[im 88/88]
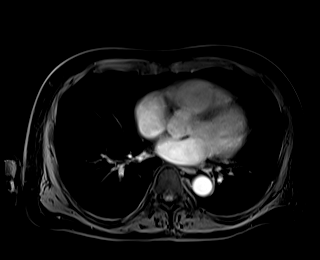

[Series 20: T1 dynamic · axial · 3.0mm · 1.25mm/px · z∈[-136,+125]mm · 3 of 88 slices shown (4 of 12)]
[im 1/88]
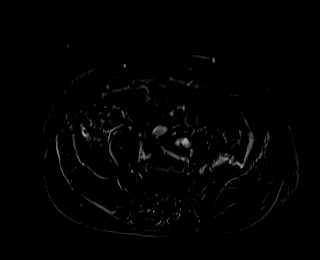
[im 44/88]
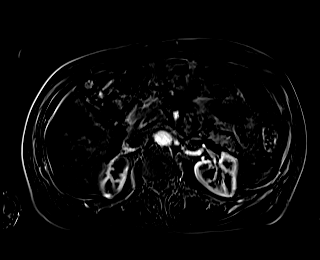
[im 88/88]
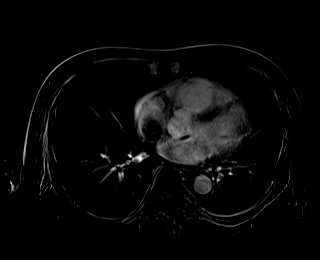

[Series 23: T1 dynamic · axial · 3.0mm · 1.25mm/px · z∈[-136,+125]mm · 3 of 88 slices shown (5 of 12)]
[im 1/88]
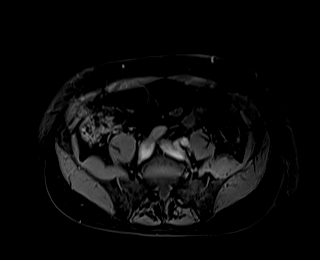
[im 44/88]
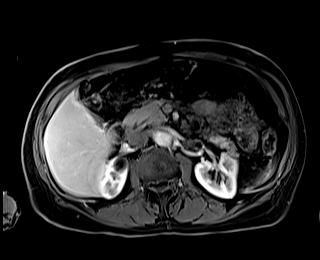
[im 88/88]
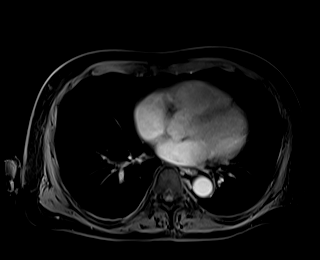

[Series 24: T1 dynamic · axial · 3.0mm · 1.25mm/px · z∈[-136,+125]mm · 3 of 88 slices shown (6 of 12)]
[im 1/88]
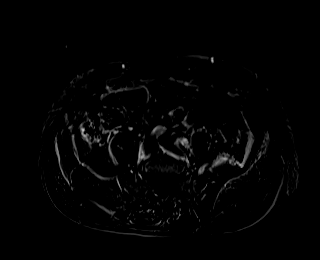
[im 44/88]
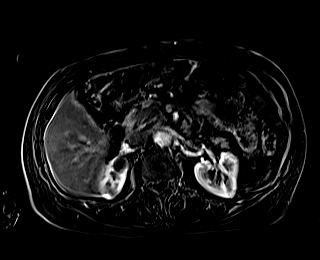
[im 88/88]
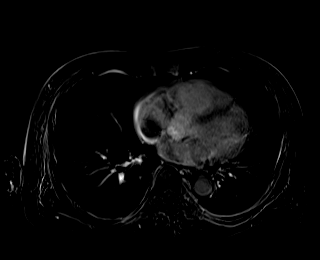

[Series 27: T1 dynamic · axial · 3.0mm · 1.25mm/px · z∈[-136,+125]mm · 3 of 88 slices shown (7 of 12)]
[im 1/88]
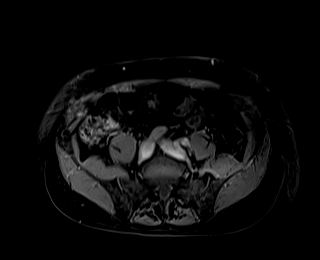
[im 44/88]
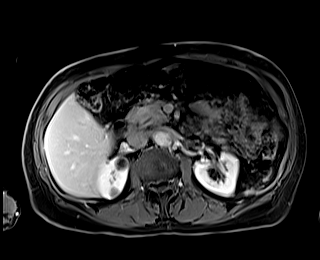
[im 88/88]
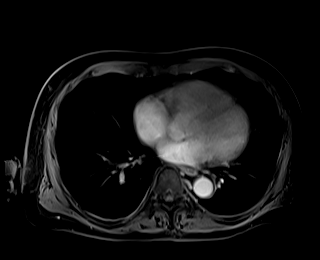

[Series 28: T1 dynamic · axial · 3.0mm · 1.25mm/px · z∈[-136,+125]mm · 3 of 88 slices shown (8 of 12)]
[im 1/88]
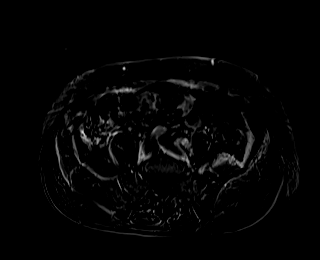
[im 44/88]
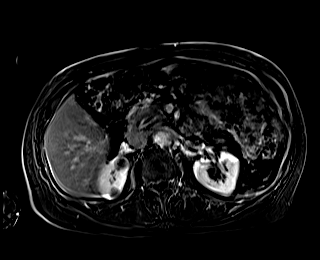
[im 88/88]
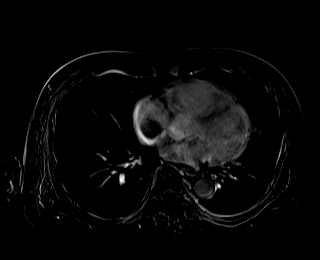

[Series 29: T1 dynamic · coronal · 3.0mm · 1.41mm/px · 3 of 80 slices shown (9 of 12)]
[im 1/80]
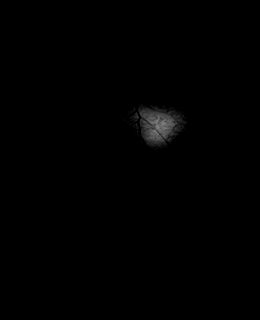
[im 40/80]
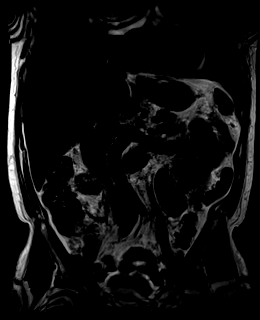
[im 80/80]
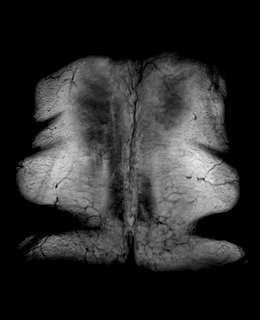

[Series 30: T1 dynamic · coronal · 3.0mm · 1.41mm/px · 3 of 80 slices shown (10 of 12)]
[im 1/80]
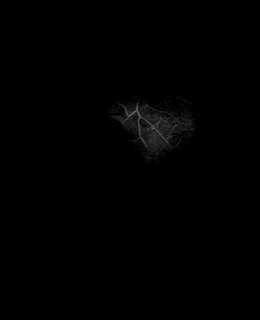
[im 40/80]
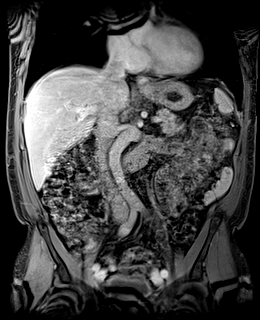
[im 80/80]
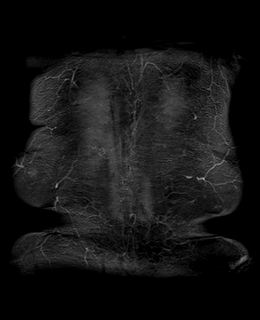

[Series 31: T2 · axial · 6.0mm · 1.56mm/px · 1 of 30 slices shown (2 of 2)]
[im 1/30]
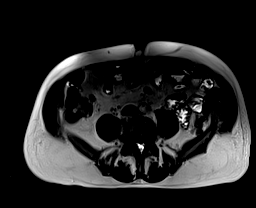

[Series 34: T1 dynamic · axial · 3.0mm · 1.25mm/px · z∈[-136,+125]mm · 3 of 88 slices shown (11 of 12)]
[im 1/88]
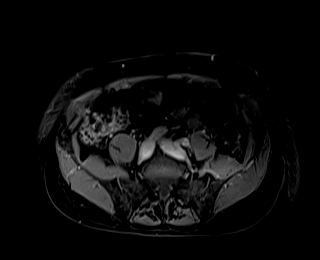
[im 44/88]
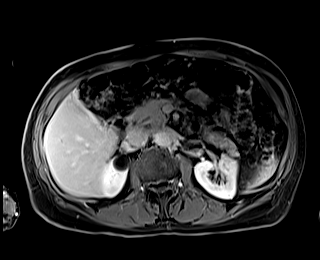
[im 88/88]
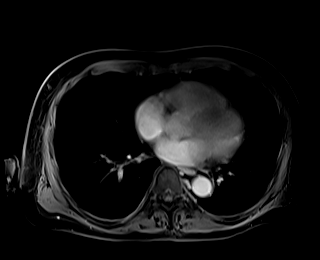

[Series 35: T1 dynamic · axial · 3.0mm · 1.25mm/px · z∈[-136,+125]mm · 3 of 88 slices shown (12 of 12)]
[im 1/88]
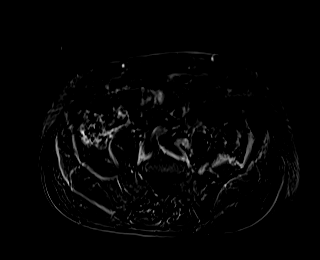
[im 44/88]
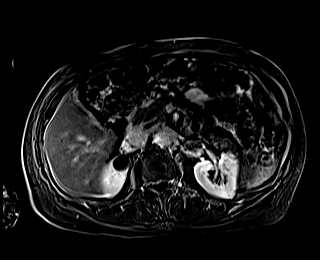
[im 88/88]
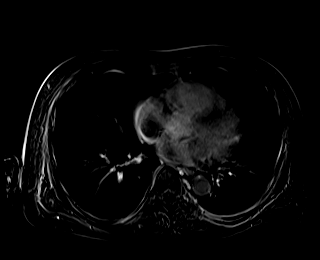

[48 of 48 positions shown; findings below may reference images not displayed]

FINDINGS: Lower chest: Trace right pleural fluid.

Hepatobiliary: Mild hepatic steatosis. No suspicious liver lesion
identified.

No gallstones identified. No gallbladder wall thickening or
pericholecystic inflammation. The 5 mm gallbladder polyp is better
seen on the ultrasound. No signs of bile duct dilatation. The common
bile duct measures 6 mm proximally and tapers normally to the level
of the ampulla. No sign choledocholithiasis.

Pancreas: No significant pancreatic edema. No signs of main duct
dilatation. Cystic lesion arising off the body of pancreas measures
2.1 x 1.4 by 3.1 cm, image [DATE] and image [DATE]. This contains 2 thin
internal areas septation. No enhancing mural nodule or thickened
irregular septation associated with this structure. This is not
significantly changed when compared with CT angio chest, abdomen and
pelvis from [DATE].

Spleen:  Within normal limits in size and appearance.

Adrenals/Urinary Tract: Normal adrenal glands. Bilateral benign
appearing Bosniak class 1 and 2 kidney cysts are noted measuring up
to 2.1 cm. No follow-up recommended. No signs of hydronephrosis.

Stomach/Bowel: Visualized portions within the abdomen are
unremarkable.

Vascular/Lymphatic: No pathologically enlarged lymph nodes
identified. No abdominal aortic aneurysm demonstrated.

Other:  No significant free fluid or fluid collections identified.

Musculoskeletal: No suspicious bone lesions identified.
IMPRESSION: 1. No gallstones or signs of choledocholithiasis. No significant
biliary ductal dilatation identified.
2. Mild hepatic steatosis
3. Cystic lesion within the body of pancreas has a maximum diameter
of 3.1 cm. No signs enhancing mural nodularity or thickened
enhancing septation. According to consensus criteria follow-up
imaging in 6 months with repeat pancreas protocol MRI without and
with contrast material is recommended. This recommendation follows
ACR consensus guidelines: Management of Incidental Pancreatic Cysts:
A White Paper of the ACR Incidental Findings Committee. [HOSPITAL] [XN];[DATE].

## 2021-07-09 MED ORDER — LORAZEPAM 2 MG/ML IJ SOLN
1.0000 mg | Freq: Once | INTRAMUSCULAR | Status: AC
  Administered 2021-07-09: 1 mg via INTRAVENOUS
  Filled 2021-07-09: qty 1

## 2021-07-09 MED ORDER — GADOBUTROL 1 MMOL/ML IV SOLN
7.0000 mL | Freq: Once | INTRAVENOUS | Status: AC | PRN
  Administered 2021-07-09: 7 mL via INTRAVENOUS

## 2021-07-09 NOTE — Consult Note (Signed)
Angel Lawson May 16, 1955  [id number].    Requesting MD: Dr. Vida RiggerMarc Magod Chief Complaint/Reason for Consult: biliary pancreatitis  HPI:  This is a 66 yo male who has a history of anxiety, HLD, and testosterone deficiency who [facility] to severe upper abdominal pain that did not radiate with nausea and several episodes of emesis.  He took pepto-bismol with no relief.  He denies fevers, CP, SOB, dysuria. This did not feel similar to his episode in March when he was evaluated in the ED for chest pain with dizziness.  At that time, he was incidentally noted to have a pancreatic body/tail cystic lesion.  He then underwent an MRI as an outpatient in the [facility] system.  This revealed a 15x3123mm cystic lesion and 6 month follow up was recommended at that time.  Upon this arrival, he underwent a CT A/P that reveals a now 4.5cm bilobed complex cystic lesion in the pancreas concerning for a cystic neoplasm.  His lipase was also noted to be >10,000.  His TB was 4.3 which has come down to 1.2 today.  The CT scan says he may have a possible 2-353mm calcified stone at the ampulla.  He then had an US which revealed no gallstones and a normal caliber CBD.  We have been asked to evaluate the patient.  ROS: ROS: Please see HPI  History reviewed. No pertinent family history.  Past Medical History:  Diagnosis Date   Anxiety    High cholesterol     History reviewed. No pertinent surgical history.  Social History:  reports that he has never smoked. He has never used smokeless tobacco. He reports that he does not drink alcohol and does not use drugs.  Allergies: No Known Allergies  Medications Prior to Admission  Medication Sig Dispense Refill   aspirin EC 81 MG tablet Take 81 mg by mouth every evening. Swallow whole.     buPROPion (WELLBUTRIN XL) 300 MG 24 hr tablet Take 300 mg by mouth every morning.     fluticasone (FLONASE) 50 MCG/ACT nasal spray Place 1 spray into both  nostrils daily as needed (seasonal allergies).     Multiple Vitamin (MULTIVITAMIN WITH MINERALS) TABS tablet Take 1 tablet by mouth every evening.     rosuvastatin (CRESTOR) 20 MG tablet Take 20 mg by mouth at bedtime.     tadalafil (CIALIS) 5 MG tablet Take 5 mg by mouth every morning.     testosterone cypionate (DEPOTESTOSTERONE CYPIONATE) 200 MG/ML injection Inject 200 mg into the muscle every 21 ( twenty-one) days. Administered by Dr. Antony HasteMichael Badger     ibuprofen (ADVIL) 200 MG tablet Take 400-600 mg by mouth daily as needed for headache (pain).       Physical Exam: Blood pressure (!) 158/82, pulse 89, temperature 98.1 F (36.7 C), temperature source Oral, resp. rate 16, height 5\' 9"  (1.753 m), weight 74.9 kg, SpO2 95 %. General: pleasant, WD, WN white male who is laying in bed in NAD HEENT: head is normocephalic, atraumatic.  Sclera are noninjected.  PERRL.  Ears and nose without any masses or lesions.  Mouth is pink and moist Heart: regular, rate, and rhythm.  Normal s1,s2. No obvious murmurs, gallops, or rubs noted.  Palpable radial and pedal pulses bilaterally Lungs: CTAB, no wheezes, rhonchi, or rales noted.  Respiratory effort nonlabored Abd: soft, NT, ND, +BS, no masses, hernias, or organomegaly MS: all 4 extremities are symmetrical with no cyanosis, clubbing, or edema.  Skin: warm and dry with no masses, lesions, or rashes Neuro: Cranial nerves 2-12 grossly intact, sensation is normal throughout Psych: A&Ox3 with an appropriate affect.   Results for orders placed or performed during the hospital encounter of 07/08/21 (from the past 48 hour(s))  Lipase, blood     Status: Abnormal   Collection Time: 07/08/21  2:37 AM  Result Value Ref Range   Lipase >10,000 (H) 11 - 51 U/L    Comment: RESULTS CONFIRMED BY MANUAL DILUTION Performed at [facility], [address], [place], [place] [address]   Comprehensive metabolic panel     Status: Abnormal   Collection  Time: 07/08/21  2:37 AM  Result Value Ref Range   Sodium 142 135 - 145 mmol/L   Potassium 3.7 3.5 - 5.1 mmol/L   Chloride 104 98 - 111 mmol/L   CO2 25 22 - 32 mmol/L   Glucose, Bld 121 (H) 70 - 99 mg/dL    Comment: Glucose reference range applies only to samples taken after fasting for at least 8 hours.   BUN 16 8 - 23 mg/dL   Creatinine, Ser 2.24 0.61 - 1.24 mg/dL   Calcium 9.5 8.9 - 82.5 mg/dL   Total Protein 7.2 6.5 - 8.1 g/dL   Albumin 4.5 3.5 - 5.0 g/dL   AST 003 (H) 15 - 41 U/L   ALT 217 (H) 0 - 44 U/L   Alkaline Phosphatase 74 38 - 126 U/L   Total Bilirubin 4.3 (H) 0.3 - 1.2 mg/dL   GFR, Estimated >70 >48 mL/min    Comment: (NOTE) Calculated using the CKD-EPI Creatinine Equation (2021)    Anion gap 13 5 - 15    Comment: Performed at [facility], [address] [place], [place] [address]  CBC     Status: None   Collection Time: 07/08/21  2:37 AM  Result Value Ref Range   WBC 8.9 4.0 - 10.5 K/uL   RBC 5.70 4.22 - 5.81 MIL/uL   Hemoglobin 16.8 13.0 - 17.0 g/dL   HCT 94.5 03.8 - 88.2 %   MCV 90.0 80.0 - 100.0 fL   MCH 29.5 26.0 - 34.0 pg   MCHC 32.7 30.0 - 36.0 g/dL   RDW 80.0 34.9 - 17.9 %   Platelets 276 150 - 400 K/uL   nRBC 0.0 0.0 - 0.2 %    Comment: Performed at [facility], [address] [place], [place] [address]  Troponin I (High Sensitivity)     Status: None   Collection Time: 07/08/21  2:37 AM  Result Value Ref Range   Troponin I (High Sensitivity) 4 <18 ng/L    Comment: (NOTE) Elevated high sensitivity troponin I (hsTnI) values and significant  changes across serial measurements may suggest ACS but many other  chronic and acute conditions are known to elevate hsTnI results.  Refer to the "Links" section for chest pain algorithms and additional  guidance. Performed at [facility], [address] [place], [place] [address]   Glucose, capillary     Status: Abnormal   Collection Time:  07/08/21 10:14 AM  Result Value Ref Range   Glucose-Capillary 122 (H) 70 - 99 mg/dL    Comment: Glucose reference range applies only to samples taken after fasting for at least 8 hours.   Comment 1 Notify RN    Comment 2 Document in Chart   MRSA Next Gen by PCR, Nasal     Status: None   Collection  Time: 07/08/21 11:03 AM   Specimen: Nasal Mucosa; Nasal Swab  Result Value Ref Range   MRSA by PCR Next Gen NOT DETECTED NOT DETECTED    Comment: (NOTE) The GeneXpert MRSA Assay (FDA approved for NASAL specimens only), is one component of a comprehensive MRSA colonization surveillance program. It is not intended to diagnose MRSA infection nor to guide or monitor treatment for MRSA infections. Test performance is not FDA approved in patients less than 22 years old. Performed at [facility], [address] [place], [place] [address]   HIV Antibody (routine testing w rflx)     Status: None   Collection Time: 07/09/21  2:51 AM  Result Value Ref Range   HIV Screen 4th Generation wRfx Non Reactive Non Reactive    Comment: Performed at [facility], [address] [place], [place] [address]  Comprehensive metabolic panel     Status: Abnormal   Collection Time: 07/09/21  2:51 AM  Result Value Ref Range   Sodium 141 135 - 145 mmol/L   Potassium 3.7 3.5 - 5.1 mmol/L   Chloride 112 (H) 98 - 111 mmol/L   CO2 24 22 - 32 mmol/L   Glucose, Bld 89 70 - 99 mg/dL    Comment: Glucose reference range applies only to samples taken after fasting for at least 8 hours.   BUN 9 8 - 23 mg/dL   Creatinine, Ser 0.98 0.61 - 1.24 mg/dL   Calcium 7.7 (L) 8.9 - 10.3 mg/dL   Total Protein 5.4 (L) 6.5 - 8.1 g/dL   Albumin 3.0 (L) 3.5 - 5.0 g/dL   AST 80 (H) 15 - 41 U/L   ALT 131 (H) 0 - 44 U/L   Alkaline Phosphatase 58 38 - 126 U/L   Total Bilirubin 1.2 0.3 - 1.2 mg/dL   GFR, Estimated >11 >91 mL/min    Comment: (NOTE) Calculated using the CKD-EPI Creatinine Equation (2021)    Anion  gap 5 5 - 15    Comment: Performed at [facility], [address], [place], [place] [address]  CBC     Status: None   Collection Time: 07/09/21  2:51 AM  Result Value Ref Range   WBC 4.9 4.0 - 10.5 K/uL   RBC 4.65 4.22 - 5.81 MIL/uL   Hemoglobin 14.3 13.0 - 17.0 g/dL   HCT 56.2 13.0 - 86.5 %   MCV 92.9 80.0 - 100.0 fL   MCH 30.8 26.0 - 34.0 pg   MCHC 33.1 30.0 - 36.0 g/dL   RDW 78.4 69.6 - 29.5 %   Platelets 206 150 - 400 K/uL   nRBC 0.0 0.0 - 0.2 %    Comment: Performed at [facility], [address] [place], [place] [address]   CT ABDOMEN PELVIS W CONTRAST  Result Date: 07/08/2021 CLINICAL DATA:  66 year old male with nausea vomiting, abdominal pain, pancreatic cystic mass in March. EXAM: CT ABDOMEN AND PELVIS WITH CONTRAST TECHNIQUE: Multidetector CT imaging of the abdomen and pelvis was performed using the standard protocol following bolus administration of intravenous contrast. RADIATION DOSE REDUCTION: This exam was performed according to the departmental dose-optimization program which includes automated exposure control, adjustment of the mA and/or kV according to patient size and/or use of iterative reconstruction technique. CONTRAST:  OMNIPAQUE IOHEXOL 300 MG/ML  SOLN COMPARISON:  CT chest, abdomen and Pelvis 04/25/2021. FINDINGS: Lower chest: Mild lung base atelectasis and/or scarring has not significantly changed. Cardiac size at  the upper limits of normal. No pericardial or pleural effusion. Hepatobiliary: Gallbladder more distended today but otherwise appears normal. CBD is larger, 8 mm now versus 5 mm in March. The CBD appears to taper distally. Small 2-3 mm calcification in the duodenum on coronal image 53, which could be at the and few low. Mild new central hepatic duct prominence. Liver enhancement remains normal. Pancreas: Bilobed cystic mass of the pancreatic body measures about 16 x 20 x 45 mm (AP by transverse by CC) versus 16 x  11 x 28 mm in March. There is mild prominence of the main pancreatic duct. No pancreatic inflammation or interval atrophy identified. No peripancreatic lymphadenopathy identified. Spleen: Negative. Adrenals/Urinary Tract: Stable and essentially negative. Small benign appearing renal cysts (no follow-up imaging recommended). Stomach/Bowel: Redundant sigmoid colon tracks into the epigastrium. Gas and retained stool in the large bowel. Severe diverticulosis in the descending colon and at the junction with the sigmoid. No active inflammation identified. Normal appendix on series 2, image 100. No dilated small bowel. Cecum appears to beyond a lax mesentery. Fluid-filled stomach and proximal duodenum today. But no convincing gastroduodenal inflammation. No free air or free fluid identified. Vascular/Lymphatic: Aortoiliac calcified atherosclerosis. Normal caliber abdominal aorta. Major arterial structures remain patent. Portal venous system is patent. No lymphadenopathy identified. Reproductive: Negative. Other: No pelvic free fluid. Musculoskeletal: Widespread lumbar vacuum disc and endplate spurring. No acute osseous abnormality identified. IMPRESSION: 1. Larger bilobed cystic mass of the pancreatic body since March, now up to 4.5 cm long axis (versus 3 cm previously). This is suspicious for a cystic Pancreatic Neoplasm, and there is associated mild prominence of the main pancreatic duct. Recommend further evaluation. No acute pancreatic inflammation or interval atrophy. 2. New dilatation of the CBD, with possible obstructing 2-3 mm stone at the ampulla. Query hyperbilirubinemia. 3. No other acute or inflammatory process identified in the abdomen or pelvis. Severe diverticulosis of the descending and sigmoid colon without active inflammation. Aortic Atherosclerosis (ICD10-I70.0). Electronically Signed   By: Odessa Fleming M.D.   On: 07/08/2021 04:55   US Abdomen Limited RUQ (LIVER/GB)  Result Date: 07/09/2021 CLINICAL DATA:   Pancreatitis. EXAM: ULTRASOUND ABDOMEN LIMITED RIGHT UPPER QUADRANT COMPARISON:  CT of Jul 08, 2021. FINDINGS: Gallbladder: No gallstones or wall thickening visualized. No sonographic Murphy sign noted by sonographer. 5 mm polyp is noted. Common bile duct: Diameter: 6 mm which is within normal limits. Liver: No focal lesion identified. Within normal limits in parenchymal echogenicity. Portal vein is patent on color Doppler imaging with normal direction of blood flow towards the liver. Other: None. IMPRESSION: Small gallbladder polyp is noted. No significant abnormality seen in the right upper quadrant of the abdomen. Electronically Signed   By: Lupita Raider M.D.   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  This all has also been discussed with Dr. Michaell Cowing as well as Dr. Freida Busman, [facility] specialist.  The patient appears to have had an episode of pancreatitis that has very rapidly resolved despite how high his presenting lipase was.  His TB was elevated at 4.3 and has come down to 1.2 which is suspicious that the calcified area on CT scan that was identified could have been a single gallstone that passed and follow up US shows no further gallstones.  However, the patient also has a concerning lesion in his  pancreas causing some dilatation of his main pancreatic duct that has grown 1.5cm in the last month.  He has no surrounding inflammatory changes on his CT scan around his pancreas which is typically seen in the setting of severe pancreatitis.  It is very difficult to tell the definitive etiology of his pancreatitis at this time, but he does need this area in the pancreas further characterized.  We have ordered a CA 19-9 as well as a new MRCP to evaluate the biliary tree for obstructing stone as well as the pancreatic lesion.  It is likely that we  will defer surgical intervention this admission and have his follow up with Dr. Freida Busman to likely consider a distal pancreatectomy with cholecystectomy at the same time.  This was thoroughly discussed with the patient and his family who were at the bedside. It was made very clear that we do NOT know for sure if this is a neoplasm but we are concerned about that and its need for resection.  They understand this.  Questions were welcomed and answered to the best of my ability with what information I have at this time.  We will follow up after the MRCP is completed for further recommendations.   FEN - soft diet, but NPO for MRCP VTE - ok for chemical prophylaxis from our standpoint  ID - none needed  HLD GAD Testosterone deficiency  I reviewed ED provider notes, Consultant GI notes, hospitalist notes, last 24 h vitals and pain scores, last 48 h intake and output, last 24 h labs and trends, and last 24 h imaging results.  Letha Cape, [facility] 07/09/2021, 1:52 PM Please see Amion for pager number during day hours 7:00am-4:30pm or 7:00am -11:30am on weekends

## 2021-07-09 NOTE — Progress Notes (Signed)
[facility] PROGRESS NOTE    Progress Note  Angel Lawson  [id number] DOB: Feb 06, 1956 DOA: 07/08/2021 PCP: Chesley Noon, MD     Brief Narrative:   Angel Lawson is an 66 y.o. male past medical history significant for anxiety diagnosed with a pancreatic cyst in early March of this year by CTA comes in complaining of severe upper abdominal pain radiating to her chest and nausea vitals are stable, her lipase was greater than [id number] complete metabolic panel was unremarkable bilirubin of 4.3 elevated LFTs CT scan of the abdomen and pelvis showed large bilobed cystic mass of the pancreatic body since March up to 4.5 cm with suspicion for cystic pancreatic neoplasm with a new dilated common bile duct stone measuring 2 to 3 mm in the ampulla severe diverticulosis no active inflammation.   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 Done at [facility] facility to 3 cm bilobed 60 pancreatic mass without worrisome features, at that time they recommended a surveillance MRI in 6 months. GI was consulted who agree with treatment and continue to monitor LFTs. They also recommended an right upper quadrant ultrasound. After ultrasound can resume a regular diet. We will probably need his gallbladder out on this admission.   Hypercholesteremia Continue statins.  Generalized anxiety disorder Continue Wellbutrin.   DVT prophylaxis: lovenox Family Communication:none Status is: Inpatient Remains inpatient appropriate because: Acute pancreatitis.    Code Status:     Code Status Orders  (From admission, onward)           Start     Ordered   07/08/21 1018  Full code  Continuous        07/08/21 1017           Code Status History     This patient has a current code status but no historical code status.         IV Access:   Peripheral IV   Procedures and diagnostic studies:   CT  ABDOMEN PELVIS W CONTRAST  Result Date: 07/08/2021 CLINICAL DATA:  66 year old male with nausea vomiting, abdominal pain, pancreatic cystic mass in March. EXAM: CT ABDOMEN AND PELVIS WITH CONTRAST TECHNIQUE: Multidetector CT imaging of the abdomen and pelvis was performed using the standard protocol following bolus administration of intravenous contrast. RADIATION DOSE REDUCTION: This exam was performed according to the departmental dose-optimization program which includes automated exposure control, adjustment of the mA and/or kV according to patient size and/or use of iterative reconstruction technique. CONTRAST:  138mL OMNIPAQUE IOHEXOL 300 MG/ML  SOLN COMPARISON:  CT chest, abdomen and Pelvis 04/25/2021. FINDINGS: Lower chest: Mild lung base atelectasis and/or scarring has not significantly changed. Cardiac size at the upper limits of normal. No pericardial or pleural effusion. Hepatobiliary: Gallbladder more distended today but otherwise appears normal. CBD is larger, 8 mm now versus 5 mm in March. The CBD appears to taper distally. Small 2-3 mm calcification in the duodenum on coronal image 53, which could be at the and few low. Mild new central hepatic duct prominence. Liver enhancement remains normal. Pancreas: Bilobed cystic mass of the pancreatic body measures about 16 x 20 x 45 mm (AP by transverse by CC) versus 16 x 11 x 28 mm in March. There is mild prominence of the main pancreatic duct. No pancreatic inflammation or interval atrophy identified. No peripancreatic lymphadenopathy identified. Spleen: Negative. Adrenals/Urinary Tract: Stable and essentially negative. Small benign appearing renal cysts (  no follow-up imaging recommended). Stomach/Bowel: Redundant sigmoid colon tracks into the epigastrium. Gas and retained stool in the large bowel. Severe diverticulosis in the descending colon and at the junction with the sigmoid. No active inflammation identified. Normal appendix on series 2, image 100.  No dilated small bowel. Cecum appears to beyond a lax mesentery. Fluid-filled stomach and proximal duodenum today. But no convincing gastroduodenal inflammation. No free air or free fluid identified. Vascular/Lymphatic: Aortoiliac calcified atherosclerosis. Normal caliber abdominal aorta. Major arterial structures remain patent. Portal venous system is patent. No lymphadenopathy identified. Reproductive: Negative. Other: No pelvic free fluid. Musculoskeletal: Widespread lumbar vacuum disc and endplate spurring. No acute osseous abnormality identified. IMPRESSION: 1. Larger bilobed cystic mass of the pancreatic body since March, now up to 4.5 cm long axis (versus 3 cm previously). This is suspicious for a cystic Pancreatic Neoplasm, and there is associated mild prominence of the main pancreatic duct. Recommend further evaluation. No acute pancreatic inflammation or interval atrophy. 2. New dilatation of the CBD, with possible obstructing 2-3 mm stone at the ampulla. Query hyperbilirubinemia. 3. No other acute or inflammatory process identified in the abdomen or pelvis. Severe diverticulosis of the descending and sigmoid colon without active inflammation. Aortic Atherosclerosis (ICD10-I70.0). Electronically Signed   By: Genevie Ann M.D.   On: 07/08/2021 04:55     Medical Consultants:   None.   Subjective:    Angel Lawson no complaints  Objective:    Vitals:   07/09/21 0100 07/09/21 0400 07/09/21 0600 07/09/21 0634  BP: 120/72 105/63    Pulse: 75 60 83   Resp: 15 12 20    Temp:  97.6 F (36.4 C)  98.2 F (36.8 C)  TempSrc:  Axillary  Oral  SpO2: 93% 97% 95%   Weight:      Height:       SpO2: 95 %   Intake/Output Summary (Last 24 hours) at 07/09/2021 0721 Last data filed at 07/09/2021 [id number] Gross per 24 hour  Intake 2635.83 ml  Output 920 ml  Net 1715.83 ml   Filed Weights   07/08/21 0150 07/08/21 1007  Weight: 74.8 kg 74.9 kg    Exam: General exam: In no acute  distress. Respiratory system: Good air movement and clear to auscultation. Cardiovascular system: S1 & S2 heard, RRR. No JVD. Gastrointestinal system: Abdomen is nondistended, soft and nontender.  Extremities: No pedal edema. Skin: No rashes, lesions or ulcers Psychiatry: Judgement and insight appear normal. Mood & affect appropriate.    Data Reviewed:    Labs: Basic Metabolic Panel: Recent Labs  Lab 07/08/21 0237 07/09/21 0251  NA 142 141  K 3.7 3.7  CL 104 112*  CO2 25 24  GLUCOSE 121* 89  BUN 16 9  CREATININE 1.06 0.93  CALCIUM 9.5 7.7*   GFR Estimated Creatinine Clearance: 76 mL/min (by C-G formula based on SCr of 0.93 mg/dL). Liver Function Tests: Recent Labs  Lab 07/08/21 0237 07/09/21 0251  AST 226* 80*  ALT 217* 131*  ALKPHOS 74 58  BILITOT 4.3* 1.2  PROT 7.2 5.4*  ALBUMIN 4.5 3.0*   Recent Labs  Lab 07/08/21 0237  LIPASE >10,000*   No results for input(s): AMMONIA in the last 168 hours. Coagulation profile No results for input(s): INR, PROTIME in the last 168 hours. COVID-19 Labs  No results for input(s): DDIMER, FERRITIN, LDH, CRP in the last 72 hours.  Lab Results  Component Value Date   [place] NEGATIVE 04/25/2021    CBC: Recent Labs  Lab 07/08/21 0237 07/09/21 0251  WBC 8.9 4.9  HGB 16.8 14.3  HCT 51.3 43.2  MCV 90.0 92.9  PLT 276 206   Cardiac Enzymes: No results for input(s): CKTOTAL, CKMB, CKMBINDEX, TROPONINI in the last 168 hours. BNP (last 3 results) No results for input(s): PROBNP in the last 8760 hours. CBG: Recent Labs  Lab 07/08/21 1014  GLUCAP 122*   D-Dimer: No results for input(s): DDIMER in the last 72 hours. Hgb A1c: No results for input(s): HGBA1C in the last 72 hours. Lipid Profile: No results for input(s): CHOL, HDL, LDLCALC, TRIG, CHOLHDL, LDLDIRECT in the last 72 hours. Thyroid function studies: No results for input(s): TSH, T4TOTAL, T3FREE, THYROIDAB in the last 72 hours.  Invalid input(s):  FREET3 Anemia work up: No results for input(s): VITAMINB12, FOLATE, FERRITIN, TIBC, IRON, RETICCTPCT in the last 72 hours. Sepsis Labs: Recent Labs  Lab 07/08/21 0237 07/09/21 0251  WBC 8.9 4.9   Microbiology Recent Results (from the past 240 hour(s))  MRSA Next Gen by PCR, Nasal     Status: None   Collection Time: 07/08/21 11:03 AM   Specimen: Nasal Mucosa; Nasal Swab  Result Value Ref Range Status   MRSA by PCR Next Gen NOT DETECTED NOT DETECTED Final    Comment: (NOTE) The GeneXpert MRSA Assay (FDA approved for NASAL specimens only), is one component of a comprehensive MRSA colonization surveillance program. It is not intended to diagnose MRSA infection nor to guide or monitor treatment for MRSA infections. Test performance is not FDA approved in patients less than 40 years old. Performed at [facility], [place] [address] [place], [place] [address]      Medications:    aspirin EC  81 mg Oral QPM   buPROPion  300 mg Oral q morning   Chlorhexidine Gluconate Cloth  6 each Topical Daily   rosuvastatin  20 mg Oral QHS   Continuous Infusions:  sodium chloride 150 mL/hr at 07/08/21 2306      LOS: 1 day   Charlynne Cousins  [facility]  07/09/2021, 7:21 AM

## 2021-07-09 NOTE — Progress Notes (Addendum)
[facility] Gastroenterology Progress Note  Angel Lawson 66 y.o. 08-Nov-1955  Subjective: Patient seen and examined laying in bed.  Patient is comfortable.  Denies abdominal pain.  Tolerating clear liquid diet well.  Denies nausea, vomiting, melena, hematochezia.  ROS : Review of Systems  Gastrointestinal:  Negative for abdominal pain, blood in stool, constipation, diarrhea, heartburn, melena, nausea and vomiting.  Genitourinary:  Negative for dysuria and urgency.     Objective: Vital signs in last 24 hours: Vitals:   07/09/21 0700 07/09/21 0800  BP:  113/67  Pulse: 63 (!) 53  Resp: 20 (!) 8  Temp:    SpO2: 95% 98%    Physical Exam:  General:  Alert, cooperative, no distress, appears stated age  Head:  Normocephalic, without obvious abnormality, atraumatic  Eyes:  Anicteric sclera, EOM's intact  Lungs:   Clear to auscultation bilaterally, respirations unlabored  Heart:  Regular rate and rhythm, S1, S2 normal  Abdomen:   Soft, non-tender, bowel sounds active all four quadrants,  no masses,   Extremities: Extremities normal, atraumatic, no  edema  Pulses: 2+ and symmetric    Lab Results: Recent Labs    07/08/21 0237 07/09/21 0251  NA 142 141  K 3.7 3.7  CL 104 112*  CO2 25 24  GLUCOSE 121* 89  BUN 16 9  CREATININE 1.06 0.93  CALCIUM 9.5 7.7*   Recent Labs    07/08/21 0237 07/09/21 0251  AST 226* 80*  ALT 217* 131*  ALKPHOS 74 58  BILITOT 4.3* 1.2  PROT 7.2 5.4*  ALBUMIN 4.5 3.0*   Recent Labs    07/08/21 0237 07/09/21 0251  WBC 8.9 4.9  HGB 16.8 14.3  HCT 51.3 43.2  MCV 90.0 92.9  PLT 276 206   No results for input(s): LABPROT, INR in the last 72 hours.    Assessment Acute Biliary pancreatitis with pancreatic cyst  CT abdomen pelvis with contrast 07/08/2021 1. Larger bilobed cystic mass of the pancreatic body since March, now up to 4.5 cm long axis (versus 3 cm previously). there is associated mild prominence of the main pancreatic duct. No acute  pancreatic inflammation or interval atrophy. 2. New dilatation of the CBD, with possible obstructing 2-3 mm stone at the ampulla. Query hyperbilirubinemia. 3.Severe diverticulosis of the descending and sigmoid colon without active inflammation.  RUQ [place] 07/09/2021 Small gallbladder polyp (0.5 mm) is noted. No significant abnormality seen in the right upper quadrant of the abdomen.  Lipase greater than 10,000 on admission.  LFTs improving.  AST 226 on admission now 88.  ALT 217 on admission now 131.  T. bili 4.3 on admission now 1.2.   Abdominal pain greatly improved.  No gallstones seen on ultrasound.  Possible gallstone pancreatitis with stone passed.  LFTs abdominal pain improving.   Plan: Agree with full liquid diet Continue supportive care Repeat MRI in 6 months for pancreatic cyst. Consider general surgery consult [facility] will follow  Emmit Alexanders PA-C 07/09/2021, 10:07 AM  Contact #  [phone]

## 2021-07-10 ENCOUNTER — Encounter (HOSPITAL_COMMUNITY)
Admission: EM | Disposition: A | Discharge status: Home / Self Care | Payer: Self-pay | Source: Home / Self Care | Attending: Internal Medicine

## 2021-07-10 ENCOUNTER — Inpatient Hospital Stay (HOSPITAL_COMMUNITY): Payer: Medicare Other | Admitting: Certified Registered Nurse Anesthetist

## 2021-07-10 ENCOUNTER — Inpatient Hospital Stay (HOSPITAL_COMMUNITY): Payer: Medicare Other

## 2021-07-10 ENCOUNTER — Other Ambulatory Visit: Payer: Self-pay

## 2021-07-10 ENCOUNTER — Encounter (HOSPITAL_COMMUNITY): Payer: Self-pay | Admitting: Internal Medicine

## 2021-07-10 DIAGNOSIS — R7989 Other specified abnormal findings of blood chemistry: Secondary | ICD-10-CM | POA: Diagnosis not present

## 2021-07-10 DIAGNOSIS — K851 Biliary acute pancreatitis without necrosis or infection: Secondary | ICD-10-CM

## 2021-07-10 DIAGNOSIS — R748 Abnormal levels of other serum enzymes: Secondary | ICD-10-CM | POA: Diagnosis not present

## 2021-07-10 DIAGNOSIS — K8050 Calculus of bile duct without cholangitis or cholecystitis without obstruction: Secondary | ICD-10-CM | POA: Diagnosis not present

## 2021-07-10 DIAGNOSIS — K429 Umbilical hernia without obstruction or gangrene: Secondary | ICD-10-CM

## 2021-07-10 DIAGNOSIS — K8012 Calculus of gallbladder with acute and chronic cholecystitis without obstruction: Secondary | ICD-10-CM

## 2021-07-10 DIAGNOSIS — K8511 Biliary acute pancreatitis with uninfected necrosis: Secondary | ICD-10-CM | POA: Diagnosis not present

## 2021-07-10 HISTORY — PX: LAPAROSCOPIC CHOLECYSTECTOMY SINGLE SITE WITH INTRAOPERATIVE CHOLANGIOGRAM: SHX6538

## 2021-07-10 LAB — COMPREHENSIVE METABOLIC PANEL
ALT: 94 U/L — ABNORMAL HIGH (ref 0–44)
AST: 40 U/L (ref 15–41)
Albumin: 3.2 g/dL — ABNORMAL LOW (ref 3.5–5.0)
Alkaline Phosphatase: 60 U/L (ref 38–126)
Anion gap: 5 (ref 5–15)
BUN: 11 mg/dL (ref 8–23)
CO2: 24 mmol/L (ref 22–32)
Calcium: 8.2 mg/dL — ABNORMAL LOW (ref 8.9–10.3)
Chloride: 114 mmol/L — ABNORMAL HIGH (ref 98–111)
Creatinine, Ser: 0.84 mg/dL (ref 0.61–1.24)
GFR, Estimated: >60 mL/min (ref >60)
Glucose, Bld: 91 mg/dL (ref 70–99)
Potassium: 3.9 mmol/L (ref 3.5–5.1)
Sodium: 143 mmol/L (ref 135–145)
Total Bilirubin: 1.1 mg/dL (ref 0.3–1.2)
Total Protein: 5.6 g/dL — ABNORMAL LOW (ref 6.5–8.1)

## 2021-07-10 LAB — PREALBUMIN: Prealbumin: 16.9 mg/dL — ABNORMAL LOW (ref 18–38)

## 2021-07-10 LAB — LIPASE, BLOOD: Lipase: 72 U/L — ABNORMAL HIGH (ref 11–51)

## 2021-07-10 LAB — CANCER ANTIGEN 19-9: CA 19-9: <2 U/mL (ref 0–35)

## 2021-07-10 IMAGING — RF DG CHOLANGIOGRAM OPERATIVE
1 series · 4 of 4 positions shown · non-contrast
Comparison: MRI/MRCP on [DATE]

CLINICAL DATA: Cholecystectomy for gallstone pancreatitis.

EXAM:
INTRAOPERATIVE CHOLANGIOGRAM
TECHNIQUE: Cholangiographic images from the C-arm fluoroscopic device were
submitted for interpretation post-operatively. Please see the
procedural report for the amount of contrast and the fluoroscopy
time utilized.
FLUOROSCOPY:
Radiation Exposure Index (as provided by the fluoroscopic device):
3.8 mGy Kerma

[Series 1: run · 4 of 69 frames shown]
[frame 11/69]
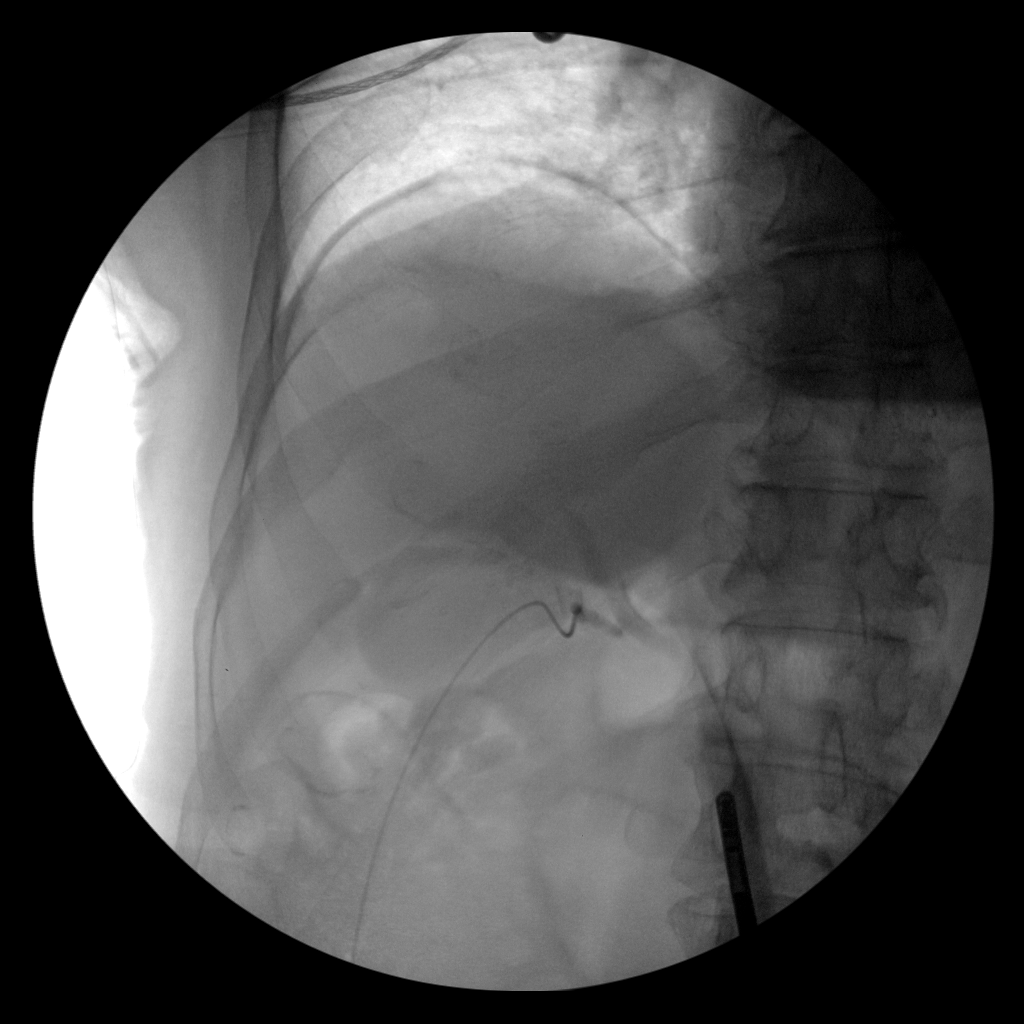
[frame 35/69]
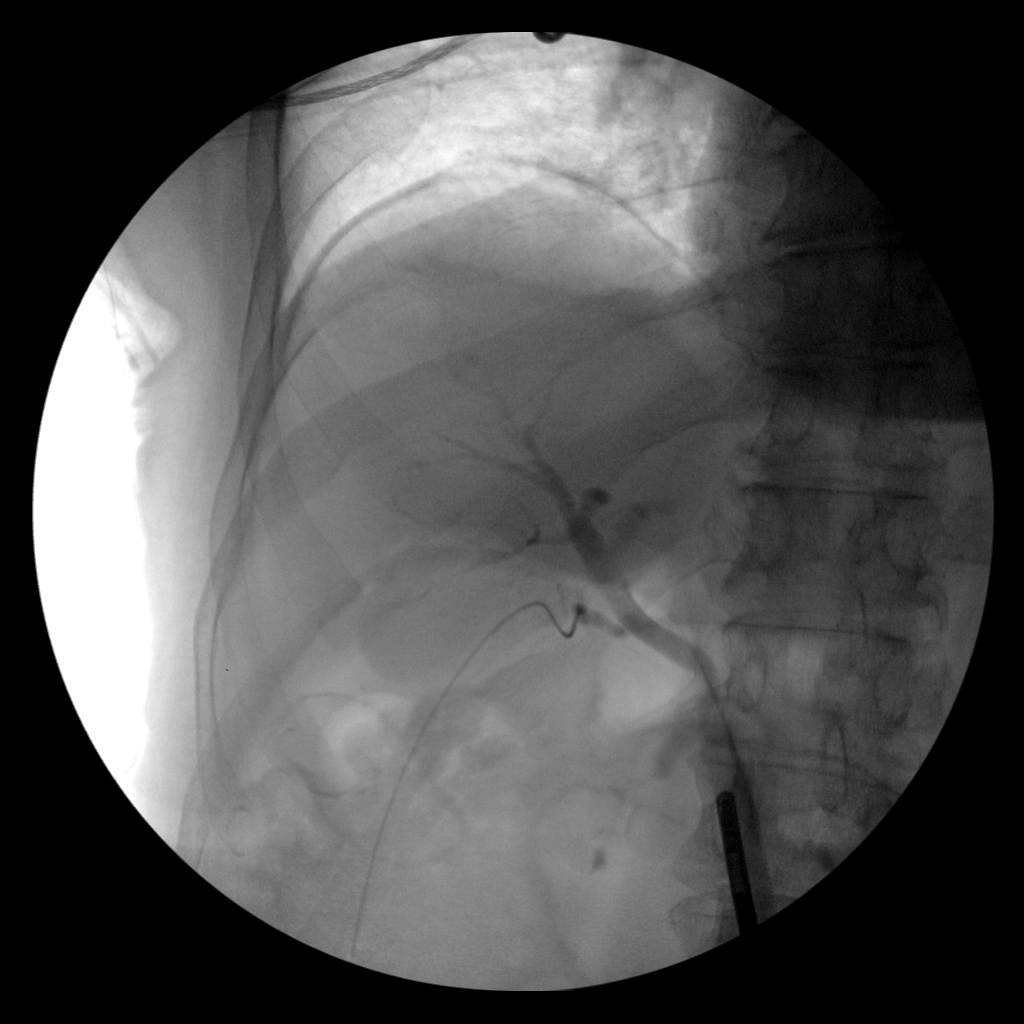
[frame 59/69]
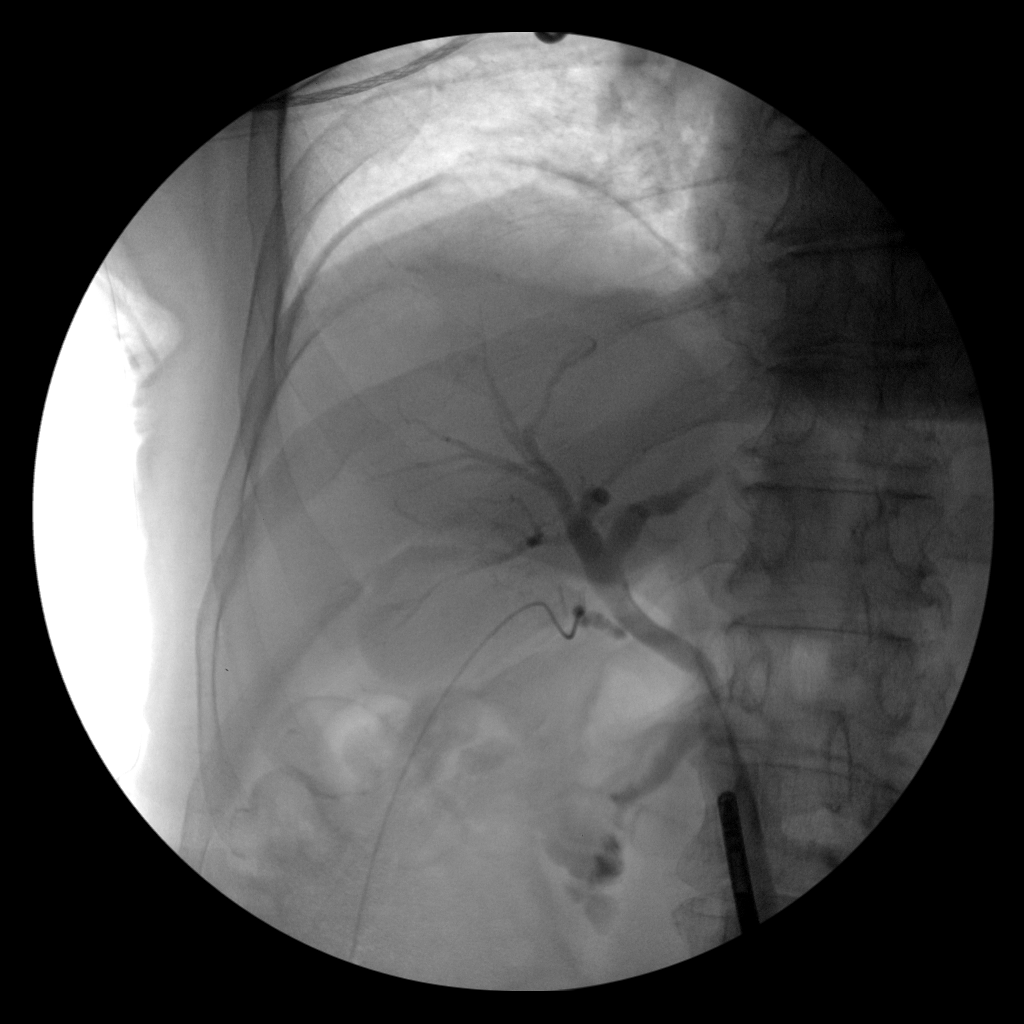
[frame 64/69]
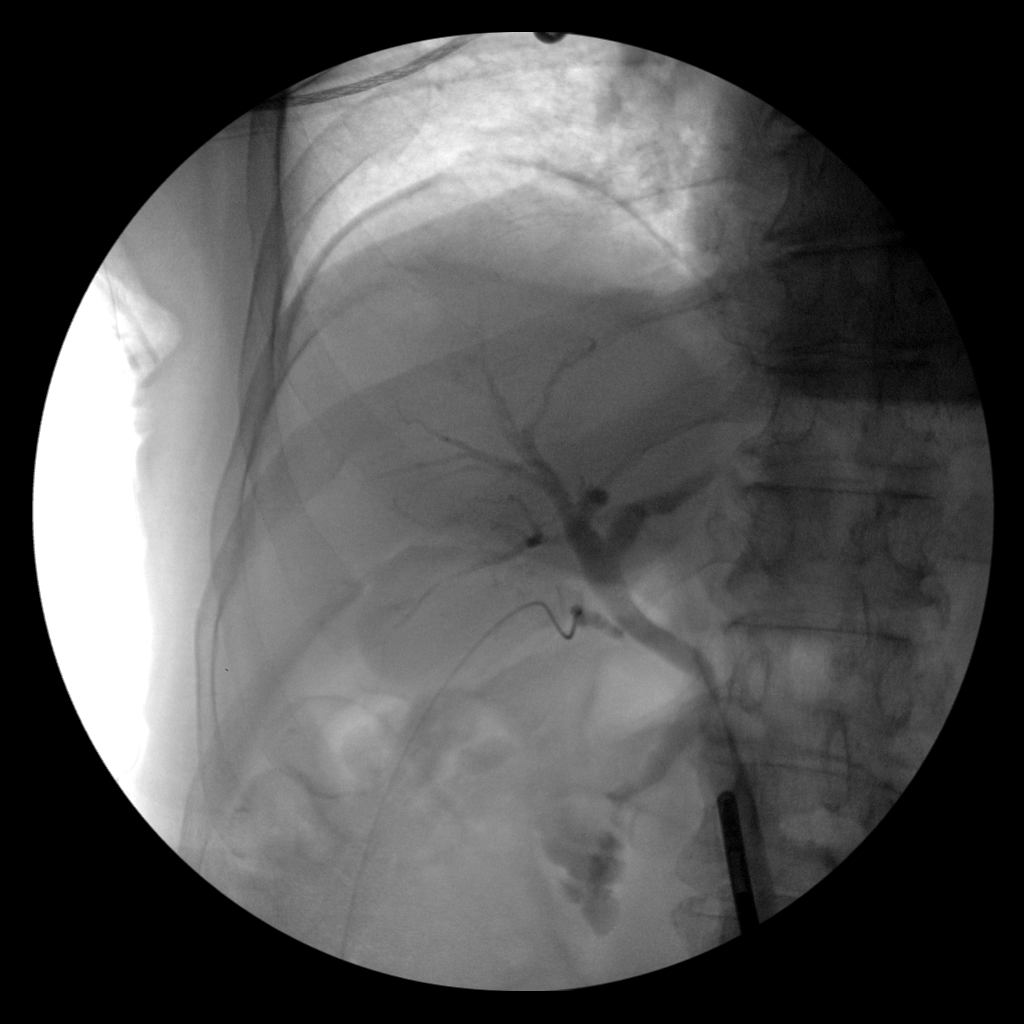

[4 of 4 positions shown; findings below may reference images not displayed]

FINDINGS: Intraoperative imaging with a C-arm demonstrates normal opacified
cystic duct stump, common bile duct and visualized intrahepatic
ducts. No evidence of biliary dilatation, stricture, filling defect
or contrast extravasation. Contrast enters the duodenum normally.
IMPRESSION: Normal intraoperative cholangiogram.

## 2021-07-10 SURGERY — LAPAROSCOPIC CHOLECYSTECTOMY SINGLE SITE WITH INTRAOPERATIVE CHOLANGIOGRAM
Anesthesia: General

## 2021-07-10 MED ORDER — DEXAMETHASONE SODIUM PHOSPHATE 10 MG/ML IJ SOLN
INTRAMUSCULAR | Status: DC | PRN
  Administered 2021-07-10: 5 mg via INTRAVENOUS

## 2021-07-10 MED ORDER — CHLORHEXIDINE GLUCONATE 0.12 % MT SOLN
15.0000 mL | Freq: Once | OROMUCOSAL | Status: AC
  Administered 2021-07-10: 15 mL via OROMUCOSAL

## 2021-07-10 MED ORDER — TRAMADOL HCL 50 MG PO TABS: 50.0000 mg | ORAL_TABLET | Freq: Four times a day (QID) | ORAL | 0 refills | Status: DC | PRN

## 2021-07-10 MED ORDER — SUGAMMADEX SODIUM 200 MG/2ML IV SOLN
INTRAVENOUS | Status: DC | PRN
  Administered 2021-07-10: 200 mg via INTRAVENOUS

## 2021-07-10 MED ORDER — CALCIUM POLYCARBOPHIL 625 MG PO TABS
625.0000 mg | ORAL_TABLET | Freq: Two times a day (BID) | ORAL | Status: DC
  Administered 2021-07-10 – 2021-07-11 (×2): 625 mg via ORAL
  Filled 2021-07-10 (×2): qty 1

## 2021-07-10 MED ORDER — ORAL CARE MOUTH RINSE: 15.0000 mL | Freq: Once | OROMUCOSAL | Status: AC

## 2021-07-10 MED ORDER — HYDROMORPHONE HCL 1 MG/ML IJ SOLN
INTRAMUSCULAR | Status: AC
  Filled 2021-07-10: qty 1

## 2021-07-10 MED ORDER — PROCHLORPERAZINE EDISYLATE 10 MG/2ML IJ SOLN: 5.0000 mg | INTRAMUSCULAR | Status: DC | PRN

## 2021-07-10 MED ORDER — MEPERIDINE HCL 50 MG/ML IJ SOLN
INTRAMUSCULAR | Status: AC
  Filled 2021-07-10: qty 1

## 2021-07-10 MED ORDER — LIDOCAINE 2% (20 MG/ML) 5 ML SYRINGE
INTRAMUSCULAR | Status: DC | PRN
  Administered 2021-07-10: 100 mg via INTRAVENOUS

## 2021-07-10 MED ORDER — PROPOFOL 10 MG/ML IV BOLUS
INTRAVENOUS | Status: DC | PRN
  Administered 2021-07-10: 150 mg via INTRAVENOUS

## 2021-07-10 MED ORDER — ROCURONIUM BROMIDE 10 MG/ML (PF) SYRINGE
PREFILLED_SYRINGE | INTRAVENOUS | Status: DC | PRN
  Administered 2021-07-10: 70 mg via INTRAVENOUS

## 2021-07-10 MED ORDER — BUPIVACAINE LIPOSOME 1.3 % IJ SUSP
20.0000 mL | Freq: Once | INTRAMUSCULAR | Status: AC
  Administered 2021-07-10: 20 mL

## 2021-07-10 MED ORDER — SODIUM CHLORIDE 0.9% FLUSH: 3.0000 mL | INTRAVENOUS | Status: DC | PRN

## 2021-07-10 MED ORDER — SODIUM CHLORIDE 0.9 % IV SOLN
2.0000 g | INTRAVENOUS | Status: AC
  Administered 2021-07-10: 2 g via INTRAVENOUS
  Filled 2021-07-10: qty 20

## 2021-07-10 MED ORDER — LACTATED RINGERS IV BOLUS
1000.0000 mL | Freq: Three times a day (TID) | INTRAVENOUS | Status: DC | PRN
Start: 2021-07-10 — End: 2021-07-11

## 2021-07-10 MED ORDER — HYDROMORPHONE HCL 1 MG/ML IJ SOLN
INTRAMUSCULAR | Status: DC | PRN
  Administered 2021-07-10: 1 mg via INTRAVENOUS

## 2021-07-10 MED ORDER — TRAMADOL HCL 50 MG PO TABS
50.0000 mg | ORAL_TABLET | Freq: Four times a day (QID) | ORAL | Status: DC | PRN
  Administered 2021-07-11: 50 mg via ORAL
  Filled 2021-07-10: qty 1

## 2021-07-10 MED ORDER — MEPERIDINE HCL 25 MG/ML IJ SOLN
INTRAMUSCULAR | Status: DC | PRN
  Administered 2021-07-10: 25 mg via INTRAVENOUS

## 2021-07-10 MED ORDER — ENSURE PRE-SURGERY PO LIQD
296.0000 mL | Freq: Once | ORAL | Status: DC
Start: 2021-07-11 — End: 2021-07-10

## 2021-07-10 MED ORDER — ACETAMINOPHEN 500 MG PO TABS
1000.0000 mg | ORAL_TABLET | ORAL | Status: AC
  Administered 2021-07-10: 1000 mg via ORAL
  Filled 2021-07-10: qty 2

## 2021-07-10 MED ORDER — LIP MEDEX EX OINT
1.0000 | TOPICAL_OINTMENT | Freq: Two times a day (BID) | CUTANEOUS | Status: DC
Start: 2021-07-10 — End: 2021-07-11
  Administered 2021-07-10 – 2021-07-11 (×2): 1 via TOPICAL
  Filled 2021-07-10: qty 7

## 2021-07-10 MED ORDER — FENTANYL CITRATE PF 50 MCG/ML IJ SOSY: 25.0000 ug | PREFILLED_SYRINGE | INTRAMUSCULAR | Status: DC | PRN

## 2021-07-10 MED ORDER — METHOCARBAMOL 1000 MG/10ML IJ SOLN
1000.0000 mg | Freq: Four times a day (QID) | INTRAVENOUS | Status: DC | PRN
  Filled 2021-07-10: qty 10

## 2021-07-10 MED ORDER — BUPIVACAINE LIPOSOME 1.3 % IJ SUSP
INTRAMUSCULAR | Status: AC
  Filled 2021-07-10: qty 20

## 2021-07-10 MED ORDER — GABAPENTIN 300 MG PO CAPS
300.0000 mg | ORAL_CAPSULE | ORAL | Status: AC
  Administered 2021-07-10: 300 mg via ORAL
  Filled 2021-07-10: qty 1

## 2021-07-10 MED ORDER — SODIUM CHLORIDE (PF) 0.9 % IJ SOLN
INTRAMUSCULAR | Status: DC | PRN
  Administered 2021-07-10: 8 mL

## 2021-07-10 MED ORDER — LIDOCAINE HCL (PF) 2 % IJ SOLN
INTRAMUSCULAR | Status: AC
  Filled 2021-07-10: qty 5

## 2021-07-10 MED ORDER — PROPOFOL 10 MG/ML IV BOLUS
INTRAVENOUS | Status: AC
  Filled 2021-07-10: qty 20

## 2021-07-10 MED ORDER — ONDANSETRON HCL 4 MG/2ML IJ SOLN
INTRAMUSCULAR | Status: DC | PRN
  Administered 2021-07-10: 4 mg via INTRAVENOUS

## 2021-07-10 MED ORDER — SIMETHICONE 40 MG/0.6ML PO SUSP: 80.0000 mg | Freq: Four times a day (QID) | ORAL | Status: DC | PRN

## 2021-07-10 MED ORDER — ACETAMINOPHEN 500 MG PO TABS
1000.0000 mg | ORAL_TABLET | Freq: Four times a day (QID) | ORAL | Status: DC
  Administered 2021-07-10 – 2021-07-11 (×3): 1000 mg via ORAL
  Filled 2021-07-10 (×3): qty 2

## 2021-07-10 MED ORDER — 0.9 % SODIUM CHLORIDE (POUR BTL) OPTIME
TOPICAL | Status: DC | PRN
Start: 2021-07-10 — End: 2021-07-10
  Administered 2021-07-10: 1000 mL

## 2021-07-10 MED ORDER — HYDROMORPHONE HCL 1 MG/ML IJ SOLN: 0.5000 mg | INTRAMUSCULAR | Status: DC | PRN

## 2021-07-10 MED ORDER — LACTATED RINGERS IV SOLN: INTRAVENOUS | Status: DC

## 2021-07-10 MED ORDER — BUPIVACAINE-EPINEPHRINE (PF) 0.25% -1:200000 IJ SOLN
INTRAMUSCULAR | Status: AC
  Filled 2021-07-10: qty 30

## 2021-07-10 MED ORDER — LACTATED RINGERS IR SOLN
Status: DC | PRN
  Administered 2021-07-10: 1000 mL

## 2021-07-10 MED ORDER — SODIUM CHLORIDE 0.9 % IV SOLN
250.0000 mL | INTRAVENOUS | Status: DC | PRN
Start: 2021-07-10 — End: 2021-07-11

## 2021-07-10 MED ORDER — FENTANYL CITRATE (PF) 250 MCG/5ML IJ SOLN
INTRAMUSCULAR | Status: DC | PRN
  Administered 2021-07-10 (×5): 50 ug via INTRAVENOUS

## 2021-07-10 MED ORDER — CHLORHEXIDINE GLUCONATE CLOTH 2 % EX PADS: 6.0000 | MEDICATED_PAD | Freq: Once | CUTANEOUS | Status: DC

## 2021-07-10 MED ORDER — FENTANYL CITRATE (PF) 250 MCG/5ML IJ SOLN
INTRAMUSCULAR | Status: AC
  Filled 2021-07-10: qty 5

## 2021-07-10 MED ORDER — BISACODYL 10 MG RE SUPP: 10.0000 mg | Freq: Two times a day (BID) | RECTAL | Status: DC | PRN

## 2021-07-10 MED ORDER — AMISULPRIDE (ANTIEMETIC) 5 MG/2ML IV SOLN: 10.0000 mg | Freq: Once | INTRAVENOUS | Status: DC | PRN

## 2021-07-10 MED ORDER — SODIUM CHLORIDE 0.9% FLUSH
3.0000 mL | Freq: Two times a day (BID) | INTRAVENOUS | Status: DC
  Administered 2021-07-11: 3 mL via INTRAVENOUS

## 2021-07-10 MED ORDER — DIPHENHYDRAMINE HCL 50 MG/ML IJ SOLN: 12.5000 mg | Freq: Four times a day (QID) | INTRAMUSCULAR | Status: DC | PRN

## 2021-07-10 MED ORDER — BUPIVACAINE-EPINEPHRINE 0.25% -1:200000 IJ SOLN
INTRAMUSCULAR | Status: DC | PRN
Start: 2021-07-10 — End: 2021-07-10
  Administered 2021-07-10: 30 mL

## 2021-07-10 MED ORDER — MAGIC MOUTHWASH
15.0000 mL | Freq: Four times a day (QID) | ORAL | Status: DC | PRN
Start: 2021-07-10 — End: 2021-07-11

## 2021-07-10 SURGICAL SUPPLY — 43 items
APPLIER CLIP 5 13 M/L LIGAMAX5 (MISCELLANEOUS) ×2
BAG COUNTER SPONGE SURGICOUNT (BAG) IMPLANT
CABLE HIGH FREQUENCY MONO STRZ (ELECTRODE) ×2 IMPLANT
CHLORAPREP W/TINT 26 (MISCELLANEOUS) ×2 IMPLANT
CLIP APPLIE 5 13 M/L LIGAMAX5 (MISCELLANEOUS) ×1 IMPLANT
COVER MAYO STAND STRL (DRAPES) ×2 IMPLANT
COVER SURGICAL LIGHT HANDLE (MISCELLANEOUS) ×2 IMPLANT
DRAIN CHANNEL 19F RND (DRAIN) IMPLANT
DRAPE C-ARM 42X120 X-RAY (DRAPES) ×2 IMPLANT
DRAPE WARM FLUID 44X44 (DRAPES) ×2 IMPLANT
DRSG TEGADERM 4X4.75 (GAUZE/BANDAGES/DRESSINGS) ×2 IMPLANT
ELECT REM PT RETURN 15FT ADLT (MISCELLANEOUS) ×2 IMPLANT
ENDOLOOP SUT PDS II  0 18 (SUTURE)
ENDOLOOP SUT PDS II 0 18 (SUTURE) IMPLANT
EVACUATOR SILICONE 100CC (DRAIN) IMPLANT
GAUZE SPONGE 2X2 8PLY STRL LF (GAUZE/BANDAGES/DRESSINGS) ×1 IMPLANT
GLOVE ECLIPSE 8.0 STRL XLNG CF (GLOVE) ×2 IMPLANT
GLOVE INDICATOR 8.0 STRL GRN (GLOVE) ×2 IMPLANT
GOWN STRL REUS W/ TWL XL LVL3 (GOWN DISPOSABLE) ×3 IMPLANT
GOWN STRL REUS W/TWL XL LVL3 (GOWN DISPOSABLE) ×6
IRRIG SUCT STRYKERFLOW 2 WTIP (MISCELLANEOUS) ×2
IRRIGATION SUCT STRKRFLW 2 WTP (MISCELLANEOUS) ×1 IMPLANT
KIT BASIN OR (CUSTOM PROCEDURE TRAY) ×2 IMPLANT
KIT TURNOVER KIT A (KITS) IMPLANT
PAD POSITIONING PINK XL (MISCELLANEOUS) ×2 IMPLANT
PENCIL SMOKE EVACUATOR (MISCELLANEOUS) IMPLANT
POUCH RETRIEVAL ECOSAC 10 (ENDOMECHANICALS) IMPLANT
POUCH RETRIEVAL ECOSAC 10MM (ENDOMECHANICALS) ×2
PROTECTOR NERVE ULNAR (MISCELLANEOUS) IMPLANT
SCISSORS LAP 5X35 DISP (ENDOMECHANICALS) ×2 IMPLANT
SET CHOLANGIOGRAPH MIX (MISCELLANEOUS) ×2 IMPLANT
SET TUBE SMOKE EVAC HIGH FLOW (TUBING) ×2 IMPLANT
SHEARS HARMONIC ACE PLUS 36CM (ENDOMECHANICALS) ×2 IMPLANT
SPIKE FLUID TRANSFER (MISCELLANEOUS) ×2 IMPLANT
SPONGE GAUZE 2X2 STER 10/PKG (GAUZE/BANDAGES/DRESSINGS) ×1
SUT MNCRL AB 4-0 PS2 18 (SUTURE) ×2 IMPLANT
SUT PDS AB 1 CT1 27 (SUTURE) ×5 IMPLANT
SYR 20ML LL LF (SYRINGE) ×2 IMPLANT
TOWEL OR 17X26 10 PK STRL BLUE (TOWEL DISPOSABLE) ×2 IMPLANT
TOWEL OR NON WOVEN STRL DISP B (DISPOSABLE) ×2 IMPLANT
TRAY LAPAROSCOPIC (CUSTOM PROCEDURE TRAY) ×2 IMPLANT
TROCAR BLADELESS OPT 5 150 (ENDOMECHANICALS) ×2 IMPLANT
TROCAR Z-THREAD OPTICAL 5X100M (TROCAR) ×2 IMPLANT

## 2021-07-10 NOTE — Progress Notes (Signed)
 Angel Lawson [id number] 10/28/2000  CARE TEAM:  PCP: Badger, Michael C, MD  Outpatient Care Team: Patient Care Team: Badger, Michael C, MD as PCP - General (Family Medicine)  Inpatient Treatment Team: Treatment Team: Attending Provider: Feliz Ortiz, Abraham, MD; Consulting Physician: Outlaw, William, MD; Rounding Team: Triadhosp, Wl5, MD; Consulting Physician: Ccs, Md, MD; Charge Nurse: Blum, Cristy, RN; Registered Nurse: Elliott, Amy E, RN; Consulting Physician: Allen, Shelby L, MD; Utilization Review: Westbrooks, Lindsay L, RN; Social Worker: Hoyle, Lucy R, LCSW   Problem List:   Principal Problem:   Gallstone pancreatitis Active Problems:   Cystic mass of pancreas   Hypercholesteremia   Generalized anxiety disorder   Abnormal LFTs   Elevated lipase   Gallbladder polyp   Common bile duct calculi - possible   Day of Surgery     Assessment  Probable gallstone pancreatitis.  Cystic mass of pancreas.  Initially appeared to be larger but on MRI appears to be stable.  (Hospital Stay = 2 days)  Plan:  -I discussion with patient on his 2 major issues.  His story seems consistent gallstone pancreatitis with epigastric pain and elevated lipase and possible choledocholithiasis on initial CAT scan.  His pain has resolved.  His lipase has nearly normalized.  I think he would benefit from cholecystectomy.  Laparoscopic with cholangiogram.  Possible liver biopsy.  Hopefully single site approach given his lack of multiple operations nor morbid obesity.  The anatomy & physiology of hepatobiliary & pancreatic function was discussed.  The pathophysiology of gallbladder dysfunction was discussed.  Natural history risks without surgery was discussed.   I feel the risks of no intervention will lead to serious problems that outweigh the operative risks; therefore, I recommended cholecystectomy to remove the pathology.  I explained laparoscopic techniques with possible need for an open  approach.  Probable cholangiogram to evaluate the bilary tract was explained as well.    Risks such as bleeding, infection, diarrhea and other bowel changes, abscess, leak, injury to other organs, need for repair of tissues / organs, need for further treatment, stroke, heart attack, death, and other risks were discussed.  I noted a good likelihood this will help address the problem, but there is a chance it may not help.  Possibility that this will not correct all abdominal symptoms was explained.  Goals of post-operative recovery were discussed as well.  We will work to minimize complications.  An educational handout further explaining the pathology and treatment options was given as well.  Questions were answered.  The patient expresses understanding & wishes to proceed with surgery.  Another important issue is a cystic mass of the pancreas.  Initial CT scan read raise concerns that it has enlarged significantly compared to last month.  However MRI/MRCP implies that the size did not seem much larger compared to the report discussed at [facility].  We reviewed the films at [employer] with my pancreatic surgical partner, Dr. Shelby Allen.  She suspects most likely an IPMN or benign cystic lesion.  Because it has not rapidly gotten larger, reasonable to follow to prove stability and no changes in characteristics.  Suspicion for rapidly growing pancreatic neoplasm seems lower.  Patient initially seemed a little frustrated and confused and could not understand that we cannot immediately pull up x-rays from [facility] and it takes some time to sort things out.  But I think he seems relatively reassured that we will follow the pancreas lesion but it is not an emergent issue.    While there is a chance he may require resection in the future such as a distal pancreatectomy,  it is not an emergent issue and not ideal to do this emergently if not needed.   I discussed this plan with the patient, his nurse, and Dr. Olevia Bowens whose  primary service with surgical hospital service.  All are in agreement with the plan of laparoscopically cystectomy dating and close outpatient follow-up on the cystic pancreatic lesion   -VTE prophylaxis- SCDs, etc -mobilize as tolerated to help recovery  Disposition:  Disposition:        I reviewed [facility] notes, last 24 h vitals and pain scores, last 48 h intake and output, last 24 h labs and trends, and last 24 h imaging results. I have reviewed this patient's available data, including medical history, events of note, test results, etc as part of my evaluation.  A significant portion of that time was spent in counseling.  Care during the described time interval was provided by me.  This care required moderate level of medical decision making.  07/10/2021    Subjective: (Chief complaint)  Patient sleeping comfortably.  Denies abdominal pain.  Questions/concerns about pancreas and other issues  Objective:  Vital signs:  Vitals:   07/09/21 1416 07/09/21 2059 07/10/21 0126 07/10/21 0505  BP: 125/77 117/73 109/72 122/73  Pulse: 63 73 61 61  Resp: 14 20 14 13   Temp: 97.6 F (36.4 C) 98.1 F (36.7 C) 97.9 F (36.6 C) 98.1 F (36.7 C)  TempSrc: Oral Oral Oral Oral  SpO2:  95% 96% 96%  Weight:      Height:        Last BM Date : 07/08/21  Intake/Output   Yesterday:  05/22 0701 - 05/23 0700 In: 1630 [P.O.:880; I.V.:750] Out: 650 [Urine:650] This shift:  No intake/output data recorded.  Bowel function:  Flatus: YES  BM:  No  Drain: (No drain)   Physical Exam:  General: Pt awake/alert in no acute distress Eyes: PERRL, normal EOM.  Sclera clear.  No icterus Neuro: CN II-XII intact w/o focal sensory/motor deficits. Lymph: No head/neck/groin lymphadenopathy Psych:  No delerium/psychosis/paranoia.  Oriented x 4  HENT: Normocephalic, Mucus membranes moist.  No thrush Neck: Supple, No tracheal deviation.  No obvious thyromegaly Chest: No pain to chest  wall compression.  Good respiratory excursion.  No audible wheezing CV:  Pulses intact.  Regular rhythm.  No major extremity edema MS: Normal AROM mjr joints.  No obvious deformity  Abdomen: Soft.  Nondistended.  Nontender.  No evidence of peritonitis.  No incarcerated hernias.  Ext:   No deformity.  No mjr edema.  No cyanosis Skin: No petechiae / purpurea.  No major sores.  Warm and dry    Results:   Cultures: Recent Results (from the past 720 hour(s))  MRSA Next Gen by PCR, Nasal     Status: None   Collection Time: 07/08/21 11:03 AM   Specimen: Nasal Mucosa; Nasal Swab  Result Value Ref Range Status   MRSA by PCR Next Gen NOT DETECTED NOT DETECTED Final    Comment: (NOTE) The GeneXpert MRSA Assay (FDA approved for NASAL specimens only), is one component of a comprehensive MRSA colonization surveillance program. It is not intended to diagnose MRSA infection nor to guide or monitor treatment for MRSA infections. Test performance is not FDA approved in patients less than 34 years old. Performed at [facility], [place] [address] [place], [place] [address]  Labs: Results for orders placed or performed during the hospital encounter of 07/08/21 (from the past 48 hour(s))  Glucose, capillary     Status: Abnormal   Collection Time: 07/08/21 10:14 AM  Result Value Ref Range   Glucose-Capillary 122 (H) 70 - 99 mg/dL    Comment: Glucose reference range applies only to samples taken after fasting for at least 8 hours.   Comment 1 Notify RN    Comment 2 Document in Chart   MRSA Next Gen by PCR, Nasal     Status: None   Collection Time: 07/08/21 11:03 AM   Specimen: Nasal Mucosa; Nasal Swab  Result Value Ref Range   MRSA by PCR Next Gen NOT DETECTED NOT DETECTED    Comment: (NOTE) The GeneXpert MRSA Assay (FDA approved for NASAL specimens only), is one component of a comprehensive MRSA colonization surveillance program. It is not intended to diagnose MRSA  infection nor to guide or monitor treatment for MRSA infections. Test performance is not FDA approved in patients less than 84 years old. Performed at [facility], [place] [address], [place], [place] [address]   HIV Antibody (routine testing w rflx)     Status: None   Collection Time: 07/09/21  2:51 AM  Result Value Ref Range   HIV Screen 4th Generation wRfx Non Reactive Non Reactive    Comment: Performed at [facility], [place] [address], [place], [place] [address]  Comprehensive metabolic panel     Status: Abnormal   Collection Time: 07/09/21  2:51 AM  Result Value Ref Range   Sodium 141 135 - 145 mmol/L   Potassium 3.7 3.5 - 5.1 mmol/L   Chloride 112 (H) 98 - 111 mmol/L   CO2 24 22 - 32 mmol/L   Glucose, Bld 89 70 - 99 mg/dL    Comment: Glucose reference range applies only to samples taken after fasting for at least 8 hours.   BUN 9 8 - 23 mg/dL   Creatinine, Ser 0.93 0.61 - 1.24 mg/dL   Calcium 7.7 (L) 8.9 - 10.3 mg/dL   Total Protein 5.4 (L) 6.5 - 8.1 g/dL   Albumin 3.0 (L) 3.5 - 5.0 g/dL   AST 80 (H) 15 - 41 U/L   ALT 131 (H) 0 - 44 U/L   Alkaline Phosphatase 58 38 - 126 U/L   Total Bilirubin 1.2 0.3 - 1.2 mg/dL   GFR, Estimated >60 >60 mL/min    Comment: (NOTE) Calculated using the CKD-EPI Creatinine Equation (2021)    Anion gap 5 5 - 15    Comment: Performed at [facility], [place] [address], [place], [place] [address]  CBC     Status: None   Collection Time: 07/09/21  2:51 AM  Result Value Ref Range   WBC 4.9 4.0 - 10.5 K/uL   RBC 4.65 4.22 - 5.81 MIL/uL   Hemoglobin 14.3 13.0 - 17.0 g/dL   HCT 43.2 39.0 - 52.0 %   MCV 92.9 80.0 - 100.0 fL   MCH 30.8 26.0 - 34.0 pg   MCHC 33.1 30.0 - 36.0 g/dL   RDW 13.9 11.5 - 15.5 %   Platelets 206 150 - 400 K/uL   nRBC 0.0 0.0 - 0.2 %    Comment: Performed at [facility], [place] [address] [place], [place] [address]  Lipase, blood     Status: Abnormal   Collection  Time: 07/09/21  2:51 AM  Result Value Ref Range  Lipase 172 (H) 11 - 51 U/L    Comment: Performed at [facility], [place] [address], [place], [place] [address]  Cancer antigen 19-9     Status: None   Collection Time: 07/09/21  2:24 PM  Result Value Ref Range   CA 19-9 <2 0 - 35 U/mL    Comment: (NOTE) Roche Diagnostics Electrochemiluminescence Immunoassay (ECLIA) Values obtained with different assay methods or kits cannot be used interchangeably.  Results cannot be interpreted as absolute evidence of the presence or absence of malignant disease. Performed At: [facility] [place], [place] [id number] Rush Farmer MD [id number]   Prealbumin     Status: Abnormal   Collection Time: 07/10/21  4:01 AM  Result Value Ref Range   Prealbumin 16.9 (L) 18 - 38 mg/dL    Comment: Performed at [facility], [place] [address] [place], [place] [address]  Comprehensive metabolic panel     Status: Abnormal   Collection Time: 07/10/21  4:01 AM  Result Value Ref Range   Sodium 143 135 - 145 mmol/L   Potassium 3.9 3.5 - 5.1 mmol/L   Chloride 114 (H) 98 - 111 mmol/L   CO2 24 22 - 32 mmol/L   Glucose, Bld 91 70 - 99 mg/dL    Comment: Glucose reference range applies only to samples taken after fasting for at least 8 hours.   BUN 11 8 - 23 mg/dL   Creatinine, Ser 0.84 0.61 - 1.24 mg/dL   Calcium 8.2 (L) 8.9 - 10.3 mg/dL   Total Protein 5.6 (L) 6.5 - 8.1 g/dL   Albumin 3.2 (L) 3.5 - 5.0 g/dL   AST 40 15 - 41 U/L   ALT 94 (H) 0 - 44 U/L   Alkaline Phosphatase 60 38 - 126 U/L   Total Bilirubin 1.1 0.3 - 1.2 mg/dL   GFR, Estimated >60 >60 mL/min    Comment: (NOTE) Calculated using the CKD-EPI Creatinine Equation (2021)    Anion gap 5 5 - 15    Comment: Performed at [facility], [place] [address] [place], [place] [address]  Lipase, blood     Status: Abnormal   Collection Time: 07/10/21  4:01 AM  Result Value Ref Range   Lipase 72  (H) 11 - 51 U/L    Comment: Performed at [facility], [place] [address] [place],  [address]    Imaging / Studies: MR 3D Recon At Scanner  Result Date: 07/09/2021 CLINICAL DATA:  Right upper quadrant pain. Enlarging cystic lesion within the pancreas. Evaluate for gallstone pancreatitis or common bile duct stone. EXAM: MRI ABDOMEN WITHOUT AND WITH CONTRAST (INCLUDING MRCP) TECHNIQUE: Multiplanar multisequence MR imaging of the abdomen was performed both before and after the administration of intravenous contrast. Heavily T2-weighted images of the biliary and pancreatic ducts were obtained, and three-dimensional MRCP images were rendered by post processing. CONTRAST:  48mL GADAVIST GADOBUTROL 1 MMOL/ML IV SOLN COMPARISON:  CT AP 07/08/2021.  Gallbladder sonogram 07/09/2021. FINDINGS: Lower chest: Trace right pleural fluid. Hepatobiliary: Mild hepatic steatosis. No suspicious liver lesion identified. No gallstones identified. No gallbladder wall thickening or pericholecystic inflammation. The 5 mm gallbladder polyp is better seen on the ultrasound. No signs of bile duct dilatation. The common bile duct measures 6 mm proximally and tapers normally to the level of the ampulla. No sign choledocholithiasis. Pancreas: No significant pancreatic edema. No signs of main duct dilatation. Cystic lesion arising off the body of pancreas  measures 2.1 x 1.4 by 3.1 cm, image 23/4 and image 13/3. This contains 2 thin internal areas septation. No enhancing mural nodule or thickened irregular septation associated with this structure. This is not significantly changed when compared with CT angio chest, abdomen and pelvis from 04/25/2021. Spleen:  Within normal limits in size and appearance. Adrenals/Urinary Tract: Normal adrenal glands. Bilateral benign appearing Bosniak class 1 and 2 kidney cysts are noted measuring up to 2.1 cm. No follow-up recommended. No signs of hydronephrosis. Stomach/Bowel:  Visualized portions within the abdomen are unremarkable. Vascular/Lymphatic: No pathologically enlarged lymph nodes identified. No abdominal aortic aneurysm demonstrated. Other:  No significant free fluid or fluid collections identified. Musculoskeletal: No suspicious bone lesions identified. IMPRESSION: 1. No gallstones or signs of choledocholithiasis. No significant biliary ductal dilatation identified. 2. Mild hepatic steatosis 3. Cystic lesion within the body of pancreas has a maximum diameter of 3.1 cm. No signs enhancing mural nodularity or thickened enhancing septation. According to consensus criteria follow-up imaging in 6 months with repeat pancreas protocol MRI without and with contrast material is recommended. This recommendation follows ACR consensus guidelines: Management of Incidental Pancreatic Cysts: A White Paper of the ACR Incidental Findings Committee. [place] [id number]. Electronically Signed   By: Kerby Moors M.D.   On: 07/09/2021 20:35   MR ABDOMEN MRCP W WO CONTAST  Result Date: 07/09/2021 CLINICAL DATA:  Right upper quadrant pain. Enlarging cystic lesion within the pancreas. Evaluate for gallstone pancreatitis or common bile duct stone. EXAM: MRI ABDOMEN WITHOUT AND WITH CONTRAST (INCLUDING MRCP) TECHNIQUE: Multiplanar multisequence MR imaging of the abdomen was performed both before and after the administration of intravenous contrast. Heavily T2-weighted images of the biliary and pancreatic ducts were obtained, and three-dimensional MRCP images were rendered by post processing. CONTRAST:  107mL GADAVIST GADOBUTROL 1 MMOL/ML IV SOLN COMPARISON:  CT AP 07/08/2021.  Gallbladder sonogram 07/09/2021. FINDINGS: Lower chest: Trace right pleural fluid. Hepatobiliary: Mild hepatic steatosis. No suspicious liver lesion identified. No gallstones identified. No gallbladder wall thickening or pericholecystic inflammation. The 5 mm gallbladder polyp is better seen on the ultrasound. No  signs of bile duct dilatation. The common bile duct measures 6 mm proximally and tapers normally to the level of the ampulla. No sign choledocholithiasis. Pancreas: No significant pancreatic edema. No signs of main duct dilatation. Cystic lesion arising off the body of pancreas measures 2.1 x 1.4 by 3.1 cm, image 23/4 and image 13/3. This contains 2 thin internal areas septation. No enhancing mural nodule or thickened irregular septation associated with this structure. This is not significantly changed when compared with CT angio chest, abdomen and pelvis from 04/25/2021. Spleen:  Within normal limits in size and appearance. Adrenals/Urinary Tract: Normal adrenal glands. Bilateral benign appearing Bosniak class 1 and 2 kidney cysts are noted measuring up to 2.1 cm. No follow-up recommended. No signs of hydronephrosis. Stomach/Bowel: Visualized portions within the abdomen are unremarkable. Vascular/Lymphatic: No pathologically enlarged lymph nodes identified. No abdominal aortic aneurysm demonstrated. Other:  No significant free fluid or fluid collections identified. Musculoskeletal: No suspicious bone lesions identified. IMPRESSION: 1. No gallstones or signs of choledocholithiasis. No significant biliary ductal dilatation identified. 2. Mild hepatic steatosis 3. Cystic lesion within the body of pancreas has a maximum diameter of 3.1 cm. No signs enhancing mural nodularity or thickened enhancing septation. According to consensus criteria follow-up imaging in 6 months with repeat pancreas protocol MRI without and with contrast material is recommended. This recommendation follows ACR consensus guidelines: Management of Incidental  Pancreatic Cysts: A White Paper of the ACR Incidental Findings Committee. [place] [id number]. Electronically Signed   By: Kerby Moors M.D.   On: 07/09/2021 20:35   US Abdomen Limited RUQ (LIVER/GB)  Result Date: 07/09/2021 CLINICAL DATA:  Pancreatitis. EXAM: ULTRASOUND  ABDOMEN LIMITED RIGHT UPPER QUADRANT COMPARISON:  CT of Jul 08, 2021. FINDINGS: Gallbladder: No gallstones or wall thickening visualized. No sonographic Murphy sign noted by sonographer. 5 mm polyp is noted. Common bile duct: Diameter: 6 mm which is within normal limits. Liver: No focal lesion identified. Within normal limits in parenchymal echogenicity. Portal vein is patent on color Doppler imaging with normal direction of blood flow towards the liver. Other: None. IMPRESSION: Small gallbladder polyp is noted. No significant abnormality seen in the right upper quadrant of the abdomen. Electronically Signed   By: Marijo Conception M.D.   On: 07/09/2021 08:20    Medications / Allergies: per chart  Antibiotics: Anti-infectives (From admission, onward)    None         Note: Portions of this report may have been transcribed using voice recognition software. Every effort was made to ensure accuracy; however, inadvertent computerized transcription errors may be present.   Any transcriptional errors that result from this process are unintentional.    Adin Hector, MD, FACS, MASCRS Esophageal, Gastrointestinal & Colorectal Surgery Robotic and Minimally Invasive Surgery  [facility], [place]  [place]. [address] [place], [place] [address] [phone] Fax [phone] Main  CONTACT INFORMATION:  Weekday (9AM-5PM): Call [facility] main office at [phone]  Weeknight (5PM-9AM) or Weekend/Holiday: Check [email or web address] (password " [email or web address]") for General Surgery [facility] coverage  (Please, do not use SecureChat as it is not reliable communication to operating surgeons for immediate patient care)      07/10/2021  8:53 AM

## 2021-07-10 NOTE — H&P (View-Only) (Signed)
Angel Lawson [id number] 1955/09/24  CARE TEAM:  PCP: Eartha Inch, MD  Outpatient Care Team: Patient Care Team: Eartha Inch, MD as PCP - General (Family Medicine)  Inpatient Treatment Team: Treatment Team: Attending Provider: Marinda Elk, MD; Consulting Physician: Willis Modena, MD; Rounding Team: Delmer Islam, MD; Consulting Physician: Montez Morita, Md, MD; Charge Nurse: Amil Amen, RN; Registered Nurse: Sanda Klein, RN; Consulting Physician: Fritzi Mandes, MD; Utilization Review: Tommie Raymond, RN; Social Worker: Anselm Pancoast, LCSW   Problem List:   Principal Problem:   Gallstone pancreatitis Active Problems:   Cystic mass of pancreas   Hypercholesteremia   Generalized anxiety disorder   Abnormal LFTs   Elevated lipase   Gallbladder polyp   Common bile duct calculi - possible   Day of Surgery     Assessment  Probable gallstone pancreatitis.  Cystic mass of pancreas.  Initially appeared to be larger but on MRI appears to be stable.  [facility] Stay = 2 days)  Plan:  -I discussion with patient on his 2 major issues.  His story seems consistent gallstone pancreatitis with epigastric pain and elevated lipase and possible choledocholithiasis on initial CAT scan.  His pain has resolved.  His lipase has nearly normalized.  I think he would benefit from cholecystectomy.  Laparoscopic with cholangiogram.  Possible liver biopsy.  Hopefully single site approach given his lack of multiple operations nor morbid obesity.  The anatomy & physiology of hepatobiliary & pancreatic function was discussed.  The pathophysiology of gallbladder dysfunction was discussed.  Natural history risks without surgery was discussed.   I feel the risks of no intervention will lead to serious problems that outweigh the operative risks; therefore, I recommended cholecystectomy to remove the pathology.  I explained laparoscopic techniques with possible need for an open  approach.  Probable cholangiogram to evaluate the bilary tract was explained as well.    Risks such as bleeding, infection, diarrhea and other bowel changes, abscess, leak, injury to other organs, need for repair of tissues / organs, need for further treatment, stroke, heart attack, death, and other risks were discussed.  I noted a good likelihood this will help address the problem, but there is a chance it may not help.  Possibility that this will not correct all abdominal symptoms was explained.  Goals of post-operative recovery were discussed as well.  We will work to minimize complications.  An educational handout further explaining the pathology and treatment options was given as well.  Questions were answered.  The patient expresses understanding & wishes to proceed with surgery.  Another important issue is a cystic mass of the pancreas.  Initial CT scan read raise concerns that it has enlarged significantly compared to last month.  However MRI/MRCP implies that the size did not seem much larger compared to the report discussed at [facility].  We reviewed the films at [facility] with my pancreatic surgical partner, Dr. Sophronia Simas.  She suspects most likely an IPMN or benign cystic lesion.  Because it has not rapidly gotten larger, reasonable to follow to prove stability and no changes in characteristics.  Suspicion for rapidly growing pancreatic neoplasm seems lower.  Patient initially seemed a little frustrated and confused and could not understand that we cannot immediately pull up x-rays from [facility] and it takes some time to sort things out.  But I think he seems relatively reassured that we will follow the pancreas lesion but it is not an emergent issue.  While there is a chance he may require resection in the future such as a distal pancreatectomy,  it is not an emergent issue and not ideal to do this emergently if not needed.   I discussed this plan with the patient, his nurse, and Dr. Olevia Bowens whose  primary service with surgical hospital service.  All are in agreement with the plan of laparoscopically cystectomy dating and close outpatient follow-up on the cystic pancreatic lesion   -VTE prophylaxis- SCDs, etc -mobilize as tolerated to help recovery  Disposition:  Disposition:        I reviewed Consultant [facility] notes, last 24 h vitals and pain scores, last 48 h intake and output, last 24 h labs and trends, and last 24 h imaging results. I have reviewed this patient's available data, including medical history, events of note, test results, etc as part of my evaluation.  A significant portion of that time was spent in counseling.  Care during the described time interval was provided by me.  This care required moderate level of medical decision making.  07/10/2021    Subjective: (Chief complaint)  Patient sleeping comfortably.  Denies abdominal pain.  Questions/concerns about pancreas and other issues  Objective:  Vital signs:  Vitals:   07/09/21 1416 07/09/21 2059 07/10/21 0126 07/10/21 0505  BP: 125/77 117/73 109/72 122/73  Pulse: 63 73 61 61  Resp: 14 20 14 13   Temp: 97.6 F (36.4 C) 98.1 F (36.7 C) 97.9 F (36.6 C) 98.1 F (36.7 C)  TempSrc: Oral Oral Oral Oral  SpO2:  95% 96% 96%  Weight:      Height:        Last BM Date : 07/08/21  Intake/Output   Yesterday:  05/22 0701 - 05/23 0700 In: 1630 [P.O.:880; I.V.:750] Out: 650 [Urine:650] This shift:  No intake/output data recorded.  Bowel function:  Flatus: YES  BM:  No  Drain: (No drain)   Physical Exam:  General: Pt awake/alert in no acute distress Eyes: PERRL, normal EOM.  Sclera clear.  No icterus Neuro: CN II-XII intact w/o focal sensory/motor deficits. Lymph: No head/neck/groin lymphadenopathy Psych:  No delerium/psychosis/paranoia.  Oriented x 4  HENT: Normocephalic, Mucus membranes moist.  No thrush Neck: Supple, No tracheal deviation.  No obvious thyromegaly Chest: No pain to chest  wall compression.  Good respiratory excursion.  No audible wheezing CV:  Pulses intact.  Regular rhythm.  No major extremity edema MS: Normal AROM mjr joints.  No obvious deformity  Abdomen: Soft.  Nondistended.  Nontender.  No evidence of peritonitis.  No incarcerated hernias.  Ext:   No deformity.  No mjr edema.  No cyanosis Skin: No petechiae / purpurea.  No major sores.  Warm and dry    Results:   Cultures: Recent Results (from the past 720 hour(s))  MRSA Next Gen by PCR, Nasal     Status: None   Collection Time: 07/08/21 11:03 AM   Specimen: Nasal Mucosa; Nasal Swab  Result Value Ref Range Status   MRSA by PCR Next Gen NOT DETECTED NOT DETECTED Final    Comment: (NOTE) The GeneXpert MRSA Assay (FDA approved for NASAL specimens only), is one component of a comprehensive MRSA colonization surveillance program. It is not intended to diagnose MRSA infection nor to guide or monitor treatment for MRSA infections. Test performance is not FDA approved in patients less than 34 years old. Performed at [facility], [place] [address] [place], [place] [address]  Labs: Results for orders placed or performed during the hospital encounter of 07/08/21 (from the past 48 hour(s))  Glucose, capillary     Status: Abnormal   Collection Time: 07/08/21 10:14 AM  Result Value Ref Range   Glucose-Capillary 122 (H) 70 - 99 mg/dL    Comment: Glucose reference range applies only to samples taken after fasting for at least 8 hours.   Comment 1 Notify RN    Comment 2 Document in Chart   MRSA Next Gen by PCR, Nasal     Status: None   Collection Time: 07/08/21 11:03 AM   Specimen: Nasal Mucosa; Nasal Swab  Result Value Ref Range   MRSA by PCR Next Gen NOT DETECTED NOT DETECTED    Comment: (NOTE) The GeneXpert MRSA Assay (FDA approved for NASAL specimens only), is one component of a comprehensive MRSA colonization surveillance program. It is not intended to diagnose MRSA  infection nor to guide or monitor treatment for MRSA infections. Test performance is not FDA approved in patients less than 84 years old. Performed at [facility], [place] [address], [place], [place] [address]   HIV Antibody (routine testing w rflx)     Status: None   Collection Time: 07/09/21  2:51 AM  Result Value Ref Range   HIV Screen 4th Generation wRfx Non Reactive Non Reactive    Comment: Performed at [facility], [place] [address], [place], [place] [address]  Comprehensive metabolic panel     Status: Abnormal   Collection Time: 07/09/21  2:51 AM  Result Value Ref Range   Sodium 141 135 - 145 mmol/L   Potassium 3.7 3.5 - 5.1 mmol/L   Chloride 112 (H) 98 - 111 mmol/L   CO2 24 22 - 32 mmol/L   Glucose, Bld 89 70 - 99 mg/dL    Comment: Glucose reference range applies only to samples taken after fasting for at least 8 hours.   BUN 9 8 - 23 mg/dL   Creatinine, Ser 0.93 0.61 - 1.24 mg/dL   Calcium 7.7 (L) 8.9 - 10.3 mg/dL   Total Protein 5.4 (L) 6.5 - 8.1 g/dL   Albumin 3.0 (L) 3.5 - 5.0 g/dL   AST 80 (H) 15 - 41 U/L   ALT 131 (H) 0 - 44 U/L   Alkaline Phosphatase 58 38 - 126 U/L   Total Bilirubin 1.2 0.3 - 1.2 mg/dL   GFR, Estimated >60 >60 mL/min    Comment: (NOTE) Calculated using the CKD-EPI Creatinine Equation (2021)    Anion gap 5 5 - 15    Comment: Performed at [facility], [place] [address], [place], [place] [address]  CBC     Status: None   Collection Time: 07/09/21  2:51 AM  Result Value Ref Range   WBC 4.9 4.0 - 10.5 K/uL   RBC 4.65 4.22 - 5.81 MIL/uL   Hemoglobin 14.3 13.0 - 17.0 g/dL   HCT 43.2 39.0 - 52.0 %   MCV 92.9 80.0 - 100.0 fL   MCH 30.8 26.0 - 34.0 pg   MCHC 33.1 30.0 - 36.0 g/dL   RDW 13.9 11.5 - 15.5 %   Platelets 206 150 - 400 K/uL   nRBC 0.0 0.0 - 0.2 %    Comment: Performed at [facility], [place] [address] [place], [place] [address]  Lipase, blood     Status: Abnormal   Collection  Time: 07/09/21  2:51 AM  Result Value Ref Range  Lipase 172 (H) 11 - 51 U/L    Comment: Performed at [facility], [place] [address], [place], [place] [address]  Cancer antigen 19-9     Status: None   Collection Time: 07/09/21  2:24 PM  Result Value Ref Range   CA 19-9 <2 0 - 35 U/mL    Comment: (NOTE) Roche Diagnostics Electrochemiluminescence Immunoassay (ECLIA) Values obtained with different assay methods or kits cannot be used interchangeably.  Results cannot be interpreted as absolute evidence of the presence or absence of malignant disease. Performed At: [facility] [place], [place] [id number] Rush Farmer MD [id number]   Prealbumin     Status: Abnormal   Collection Time: 07/10/21  4:01 AM  Result Value Ref Range   Prealbumin 16.9 (L) 18 - 38 mg/dL    Comment: Performed at [facility], [place] [address] [place], [place] [address]  Comprehensive metabolic panel     Status: Abnormal   Collection Time: 07/10/21  4:01 AM  Result Value Ref Range   Sodium 143 135 - 145 mmol/L   Potassium 3.9 3.5 - 5.1 mmol/L   Chloride 114 (H) 98 - 111 mmol/L   CO2 24 22 - 32 mmol/L   Glucose, Bld 91 70 - 99 mg/dL    Comment: Glucose reference range applies only to samples taken after fasting for at least 8 hours.   BUN 11 8 - 23 mg/dL   Creatinine, Ser 0.84 0.61 - 1.24 mg/dL   Calcium 8.2 (L) 8.9 - 10.3 mg/dL   Total Protein 5.6 (L) 6.5 - 8.1 g/dL   Albumin 3.2 (L) 3.5 - 5.0 g/dL   AST 40 15 - 41 U/L   ALT 94 (H) 0 - 44 U/L   Alkaline Phosphatase 60 38 - 126 U/L   Total Bilirubin 1.1 0.3 - 1.2 mg/dL   GFR, Estimated >60 >60 mL/min    Comment: (NOTE) Calculated using the CKD-EPI Creatinine Equation (2021)    Anion gap 5 5 - 15    Comment: Performed at [facility], [place] [address] [place], [place] [address]  Lipase, blood     Status: Abnormal   Collection Time: 07/10/21  4:01 AM  Result Value Ref Range   Lipase 72  (H) 11 - 51 U/L    Comment: Performed at [facility], [place] [address] [place],  [address]    Imaging / Studies: MR 3D Recon At Scanner  Result Date: 07/09/2021 CLINICAL DATA:  Right upper quadrant pain. Enlarging cystic lesion within the pancreas. Evaluate for gallstone pancreatitis or common bile duct stone. EXAM: MRI ABDOMEN WITHOUT AND WITH CONTRAST (INCLUDING MRCP) TECHNIQUE: Multiplanar multisequence MR imaging of the abdomen was performed both before and after the administration of intravenous contrast. Heavily T2-weighted images of the biliary and pancreatic ducts were obtained, and three-dimensional MRCP images were rendered by post processing. CONTRAST:  48mL GADAVIST GADOBUTROL 1 MMOL/ML IV SOLN COMPARISON:  CT AP 07/08/2021.  Gallbladder sonogram 07/09/2021. FINDINGS: Lower chest: Trace right pleural fluid. Hepatobiliary: Mild hepatic steatosis. No suspicious liver lesion identified. No gallstones identified. No gallbladder wall thickening or pericholecystic inflammation. The 5 mm gallbladder polyp is better seen on the ultrasound. No signs of bile duct dilatation. The common bile duct measures 6 mm proximally and tapers normally to the level of the ampulla. No sign choledocholithiasis. Pancreas: No significant pancreatic edema. No signs of main duct dilatation. Cystic lesion arising off the body of pancreas  measures 2.1 x 1.4 by 3.1 cm, image 23/4 and image 13/3. This contains 2 thin internal areas septation. No enhancing mural nodule or thickened irregular septation associated with this structure. This is not significantly changed when compared with CT angio chest, abdomen and pelvis from 04/25/2021. Spleen:  Within normal limits in size and appearance. Adrenals/Urinary Tract: Normal adrenal glands. Bilateral benign appearing Bosniak class 1 and 2 kidney cysts are noted measuring up to 2.1 cm. No follow-up recommended. No signs of hydronephrosis. Stomach/Bowel:  Visualized portions within the abdomen are unremarkable. Vascular/Lymphatic: No pathologically enlarged lymph nodes identified. No abdominal aortic aneurysm demonstrated. Other:  No significant free fluid or fluid collections identified. Musculoskeletal: No suspicious bone lesions identified. IMPRESSION: 1. No gallstones or signs of choledocholithiasis. No significant biliary ductal dilatation identified. 2. Mild hepatic steatosis 3. Cystic lesion within the body of pancreas has a maximum diameter of 3.1 cm. No signs enhancing mural nodularity or thickened enhancing septation. According to consensus criteria follow-up imaging in 6 months with repeat pancreas protocol MRI without and with contrast material is recommended. This recommendation follows ACR consensus guidelines: Management of Incidental Pancreatic Cysts: A White Paper of the ACR Incidental Findings Committee. [place] [id number]. Electronically Signed   By: Kerby Moors M.D.   On: 07/09/2021 20:35   MR ABDOMEN MRCP W WO CONTAST  Result Date: 07/09/2021 CLINICAL DATA:  Right upper quadrant pain. Enlarging cystic lesion within the pancreas. Evaluate for gallstone pancreatitis or common bile duct stone. EXAM: MRI ABDOMEN WITHOUT AND WITH CONTRAST (INCLUDING MRCP) TECHNIQUE: Multiplanar multisequence MR imaging of the abdomen was performed both before and after the administration of intravenous contrast. Heavily T2-weighted images of the biliary and pancreatic ducts were obtained, and three-dimensional MRCP images were rendered by post processing. CONTRAST:  107mL GADAVIST GADOBUTROL 1 MMOL/ML IV SOLN COMPARISON:  CT AP 07/08/2021.  Gallbladder sonogram 07/09/2021. FINDINGS: Lower chest: Trace right pleural fluid. Hepatobiliary: Mild hepatic steatosis. No suspicious liver lesion identified. No gallstones identified. No gallbladder wall thickening or pericholecystic inflammation. The 5 mm gallbladder polyp is better seen on the ultrasound. No  signs of bile duct dilatation. The common bile duct measures 6 mm proximally and tapers normally to the level of the ampulla. No sign choledocholithiasis. Pancreas: No significant pancreatic edema. No signs of main duct dilatation. Cystic lesion arising off the body of pancreas measures 2.1 x 1.4 by 3.1 cm, image 23/4 and image 13/3. This contains 2 thin internal areas septation. No enhancing mural nodule or thickened irregular septation associated with this structure. This is not significantly changed when compared with CT angio chest, abdomen and pelvis from 04/25/2021. Spleen:  Within normal limits in size and appearance. Adrenals/Urinary Tract: Normal adrenal glands. Bilateral benign appearing Bosniak class 1 and 2 kidney cysts are noted measuring up to 2.1 cm. No follow-up recommended. No signs of hydronephrosis. Stomach/Bowel: Visualized portions within the abdomen are unremarkable. Vascular/Lymphatic: No pathologically enlarged lymph nodes identified. No abdominal aortic aneurysm demonstrated. Other:  No significant free fluid or fluid collections identified. Musculoskeletal: No suspicious bone lesions identified. IMPRESSION: 1. No gallstones or signs of choledocholithiasis. No significant biliary ductal dilatation identified. 2. Mild hepatic steatosis 3. Cystic lesion within the body of pancreas has a maximum diameter of 3.1 cm. No signs enhancing mural nodularity or thickened enhancing septation. According to consensus criteria follow-up imaging in 6 months with repeat pancreas protocol MRI without and with contrast material is recommended. This recommendation follows ACR consensus guidelines: Management of Incidental  Pancreatic Cysts: A White Paper of the ACR Incidental Findings Committee. [place] [id number]. Electronically Signed   By: Kerby Moors M.D.   On: 07/09/2021 20:35   US Abdomen Limited RUQ (LIVER/GB)  Result Date: 07/09/2021 CLINICAL DATA:  Pancreatitis. EXAM: ULTRASOUND  ABDOMEN LIMITED RIGHT UPPER QUADRANT COMPARISON:  CT of Jul 08, 2021. FINDINGS: Gallbladder: No gallstones or wall thickening visualized. No sonographic Murphy sign noted by sonographer. 5 mm polyp is noted. Common bile duct: Diameter: 6 mm which is within normal limits. Liver: No focal lesion identified. Within normal limits in parenchymal echogenicity. Portal vein is patent on color Doppler imaging with normal direction of blood flow towards the liver. Other: None. IMPRESSION: Small gallbladder polyp is noted. No significant abnormality seen in the right upper quadrant of the abdomen. Electronically Signed   By: Marijo Conception M.D.   On: 07/09/2021 08:20    Medications / Allergies: per chart  Antibiotics: Anti-infectives (From admission, onward)    None         Note: Portions of this report may have been transcribed using voice recognition software. Every effort was made to ensure accuracy; however, inadvertent computerized transcription errors may be present.   Any transcriptional errors that result from this process are unintentional.    Adin Hector, MD, FACS, MASCRS Esophageal, Gastrointestinal & Colorectal Surgery Robotic and Minimally Invasive Surgery  [facility], [place]  [place]. [address] [place], [place] [address] [phone] Fax [phone] Main  CONTACT INFORMATION:  Weekday (9AM-5PM): Call [facility] main office at [phone]  Weeknight (5PM-9AM) or Weekend/Holiday: Check [email or web address] (password " [email or web address]") for General Surgery [facility] coverage  (Please, do not use SecureChat as it is not reliable communication to operating surgeons for immediate patient care)      07/10/2021  8:53 AM

## 2021-07-10 NOTE — Progress Notes (Signed)
[facility] PROGRESS NOTE    Progress Note  Angel Lawson  OJJ:[id number] DOB: 1955/09/01 DOA: 07/08/2021 PCP: Eartha Inch, MD     Brief Narrative:   Angel Lawson is an 66 y.o. male past medical history significant for anxiety diagnosed with a pancreatic cyst in early March of this year by CTA comes in complaining of severe upper abdominal pain radiating to her chest and nausea vitals are stable, her lipase was greater than 10,000 complete metabolic panel was unremarkable bilirubin of 4.3 elevated LFTs CT scan of the abdomen and pelvis showed large bilobed cystic mass of the pancreatic body since March up to 4.5 cm with suspicion for cystic pancreatic neoplasm with a new dilated common bile duct stone measuring 2 to 3 mm in the ampulla severe diverticulosis no active inflammation.   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 Surgery was consulted recommended an MRCP that showed no signs of gallstone in the common bile duct, mild hepatic steatosis.  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 For lap chole today   Hypercholesteremia Continue statins.  Generalized anxiety disorder Continue Wellbutrin.   DVT prophylaxis: lovenox Family Communication:none Status is: Inpatient Remains inpatient appropriate because: Acute pancreatitis.    Code Status:     Code Status Orders  (From admission, onward)           Start     Ordered   07/08/21 1018  Full code  Continuous        07/08/21 1017           Code Status History     This patient has a current code status but no historical code status.         IV Access:   Peripheral IV   Procedures and diagnostic studies:   MR 3D Recon At Scanner  Result Date: 07/09/2021 CLINICAL DATA:  Right upper quadrant pain. Enlarging cystic lesion within the pancreas. Evaluate for gallstone pancreatitis or  common bile duct stone. EXAM: MRI ABDOMEN WITHOUT AND WITH CONTRAST (INCLUDING MRCP) TECHNIQUE: Multiplanar multisequence MR imaging of the abdomen was performed both before and after the administration of intravenous contrast. Heavily T2-weighted images of the biliary and pancreatic ducts were obtained, and three-dimensional MRCP images were rendered by post processing. CONTRAST:  7mL GADAVIST GADOBUTROL 1 MMOL/ML IV SOLN COMPARISON:  CT AP 07/08/2021.  Gallbladder sonogram 07/09/2021. FINDINGS: Lower chest: Trace right pleural fluid. Hepatobiliary: Mild hepatic steatosis. No suspicious liver lesion identified. No gallstones identified. No gallbladder wall thickening or pericholecystic inflammation. The 5 mm gallbladder polyp is better seen on the ultrasound. No signs of bile duct dilatation. The common bile duct measures 6 mm proximally and tapers normally to the level of the ampulla. No sign choledocholithiasis. Pancreas: No significant pancreatic edema. No signs of main duct dilatation. Cystic lesion arising off the body of pancreas measures 2.1 x 1.4 by 3.1 cm, image 23/4 and image 13/3. This contains 2 thin internal areas septation. No enhancing mural nodule or thickened irregular septation associated with this structure. This is not significantly changed when compared with CT angio chest, abdomen and pelvis from 04/25/2021. Spleen:  Within normal limits in size and appearance. Adrenals/Urinary Tract: Normal adrenal glands. Bilateral benign appearing Bosniak class 1 and 2 kidney cysts are noted measuring up to 2.1 cm. No follow-up recommended. No signs of hydronephrosis. Stomach/Bowel: Visualized portions within the abdomen are unremarkable. Vascular/Lymphatic:  No pathologically enlarged lymph nodes identified. No abdominal aortic aneurysm demonstrated. Other:  No significant free fluid or fluid collections identified. Musculoskeletal: No suspicious bone lesions identified. IMPRESSION: 1. No gallstones or  signs of choledocholithiasis. No significant biliary ductal dilatation identified. 2. Mild hepatic steatosis 3. Cystic lesion within the body of pancreas has a maximum diameter of 3.1 cm. No signs enhancing mural nodularity or thickened enhancing septation. According to consensus criteria follow-up imaging in 6 months with repeat pancreas protocol MRI without and with contrast material is recommended. This recommendation follows ACR consensus guidelines: Management of Incidental Pancreatic Cysts: A White Paper of the ACR Incidental Findings Committee. J Am Coll Radiol 2017;14:911-923. Electronically Signed   By: Signa Kell M.D.   On: 07/09/2021 20:35   MR ABDOMEN MRCP W WO CONTAST  Result Date: 07/09/2021 CLINICAL DATA:  Right upper quadrant pain. Enlarging cystic lesion within the pancreas. Evaluate for gallstone pancreatitis or common bile duct stone. EXAM: MRI ABDOMEN WITHOUT AND WITH CONTRAST (INCLUDING MRCP) TECHNIQUE: Multiplanar multisequence MR imaging of the abdomen was performed both before and after the administration of intravenous contrast. Heavily T2-weighted images of the biliary and pancreatic ducts were obtained, and three-dimensional MRCP images were rendered by post processing. CONTRAST:  26mL GADAVIST GADOBUTROL 1 MMOL/ML IV SOLN COMPARISON:  CT AP 07/08/2021.  Gallbladder sonogram 07/09/2021. FINDINGS: Lower chest: Trace right pleural fluid. Hepatobiliary: Mild hepatic steatosis. No suspicious liver lesion identified. No gallstones identified. No gallbladder wall thickening or pericholecystic inflammation. The 5 mm gallbladder polyp is better seen on the ultrasound. No signs of bile duct dilatation. The common bile duct measures 6 mm proximally and tapers normally to the level of the ampulla. No sign choledocholithiasis. Pancreas: No significant pancreatic edema. No signs of main duct dilatation. Cystic lesion arising off the body of pancreas measures 2.1 x 1.4 by 3.1 cm, image 23/4 and  image 13/3. This contains 2 thin internal areas septation. No enhancing mural nodule or thickened irregular septation associated with this structure. This is not significantly changed when compared with CT angio chest, abdomen and pelvis from 04/25/2021. Spleen:  Within normal limits in size and appearance. Adrenals/Urinary Tract: Normal adrenal glands. Bilateral benign appearing Bosniak class 1 and 2 kidney cysts are noted measuring up to 2.1 cm. No follow-up recommended. No signs of hydronephrosis. Stomach/Bowel: Visualized portions within the abdomen are unremarkable. Vascular/Lymphatic: No pathologically enlarged lymph nodes identified. No abdominal aortic aneurysm demonstrated. Other:  No significant free fluid or fluid collections identified. Musculoskeletal: No suspicious bone lesions identified. IMPRESSION: 1. No gallstones or signs of choledocholithiasis. No significant biliary ductal dilatation identified. 2. Mild hepatic steatosis 3. Cystic lesion within the body of pancreas has a maximum diameter of 3.1 cm. No signs enhancing mural nodularity or thickened enhancing septation. According to consensus criteria follow-up imaging in 6 months with repeat pancreas protocol MRI without and with contrast material is recommended. This recommendation follows ACR consensus guidelines: Management of Incidental Pancreatic Cysts: A White Paper of the ACR Incidental Findings Committee. J Am Coll Radiol 2017;14:911-923. Electronically Signed   By: Signa Kell M.D.   On: 07/09/2021 20:35   US Abdomen Limited RUQ (LIVER/GB)  Result Date: 07/09/2021 CLINICAL DATA:  Pancreatitis. EXAM: ULTRASOUND ABDOMEN LIMITED RIGHT UPPER QUADRANT COMPARISON:  CT of Jul 08, 2021. FINDINGS: Gallbladder: No gallstones or wall thickening visualized. No sonographic Murphy sign noted by sonographer. 5 mm polyp is noted. Common bile duct: Diameter: 6 mm which is within normal limits. Liver: No focal lesion  identified. Within normal limits  in parenchymal echogenicity. Portal vein is patent on color Doppler imaging with normal direction of blood flow towards the liver. Other: None. IMPRESSION: Small gallbladder polyp is noted. No significant abnormality seen in the right upper quadrant of the abdomen. Electronically Signed   By: Lupita RaiderJames  Green Jr M.D.   On: 07/09/2021 08:20     Medical Consultants:   None.   Subjective:    Angel Lawson no further pain To proceed with surgical intervention.  Objective:    Vitals:   07/09/21 1416 07/09/21 2059 07/10/21 0126 07/10/21 0505  BP: 125/77 117/73 109/72 122/73  Pulse: 63 73 61 61  Resp: 14 20 14 13   Temp: 97.6 F (36.4 C) 98.1 F (36.7 C) 97.9 F (36.6 C) 98.1 F (36.7 C)  TempSrc: Oral Oral Oral Oral  SpO2:  95% 96% 96%  Weight:      Height:       SpO2: 96 %   Intake/Output Summary (Last 24 hours) at 07/10/2021 0851 Last data filed at 07/10/2021 0600 Gross per 24 hour  Intake 1630 ml  Output 650 ml  Net 980 ml    Filed Weights   07/08/21 0150 07/08/21 1007  Weight: 74.8 kg 74.9 kg    Exam: General exam: In no acute distress. Respiratory system: Good air movement and clear to auscultation. Cardiovascular system: S1 & S2 heard, RRR. No JVD. Gastrointestinal system: Abdomen is nondistended, soft and nontender.  Extremities: No pedal edema. Skin: No rashes, lesions or ulcers Psychiatry: Judgement and insight appear normal. Mood & affect appropriate.   Data Reviewed:    Labs: Basic Metabolic Panel: Recent Labs  Lab 07/08/21 0237 07/09/21 0251 07/10/21 0401  NA 142 141 143  K 3.7 3.7 3.9  CL 104 112* 114*  CO2 25 24 24   GLUCOSE 121* 89 91  BUN 16 9 11   CREATININE 1.06 0.93 0.84  CALCIUM 9.5 7.7* 8.2*    GFR Estimated Creatinine Clearance: 84.2 mL/min (by C-G formula based on SCr of 0.84 mg/dL). Liver Function Tests: Recent Labs  Lab 07/08/21 0237 07/09/21 0251 07/10/21 0401  AST 226* 80* 40  ALT 217* 131* 94*  ALKPHOS 74 58 60   BILITOT 4.3* 1.2 1.1  PROT 7.2 5.4* 5.6*  ALBUMIN 4.5 3.0* 3.2*    Recent Labs  Lab 07/08/21 0237 07/09/21 0251 07/10/21 0401  LIPASE >10,000* 172* 72*    No results for input(s): AMMONIA in the last 168 hours. Coagulation profile No results for input(s): INR, PROTIME in the last 168 hours. COVID-19 Labs  No results for input(s): DDIMER, FERRITIN, LDH, CRP in the last 72 hours.  Lab Results  Component Value Date   SARSCOV2NAA NEGATIVE 04/25/2021    CBC: Recent Labs  Lab 07/08/21 0237 07/09/21 0251  WBC 8.9 4.9  HGB 16.8 14.3  HCT 51.3 43.2  MCV 90.0 92.9  PLT 276 206    Cardiac Enzymes: No results for input(s): CKTOTAL, CKMB, CKMBINDEX, TROPONINI in the last 168 hours. BNP (last 3 results) No results for input(s): PROBNP in the last 8760 hours. CBG: Recent Labs  Lab 07/08/21 1014  GLUCAP 122*    D-Dimer: No results for input(s): DDIMER in the last 72 hours. Hgb A1c: No results for input(s): HGBA1C in the last 72 hours. Lipid Profile: No results for input(s): CHOL, HDL, LDLCALC, TRIG, CHOLHDL, LDLDIRECT in the last 72 hours. Thyroid function studies: No results for input(s): TSH, T4TOTAL, T3FREE, THYROIDAB in the last 72  hours.  Invalid input(s): FREET3 Anemia work up: No results for input(s): VITAMINB12, FOLATE, FERRITIN, TIBC, IRON, RETICCTPCT in the last 72 hours. Sepsis Labs: Recent Labs  Lab 07/08/21 0237 07/09/21 0251  WBC 8.9 4.9    Microbiology Recent Results (from the past 240 hour(s))  MRSA Next Gen by PCR, Nasal     Status: None   Collection Time: 07/08/21 11:03 AM   Specimen: Nasal Mucosa; Nasal Swab  Result Value Ref Range Status   MRSA by PCR Next Gen NOT DETECTED NOT DETECTED Final    Comment: (NOTE) The GeneXpert MRSA Assay (FDA approved for NASAL specimens only), is one component of a comprehensive MRSA colonization surveillance program. It is not intended to diagnose MRSA infection nor to guide or monitor treatment for  MRSA infections. Test performance is not FDA approved in patients less than 37 years old. Performed at [facility], [address] [place], [place] [address]      Medications:    aspirin EC  81 mg Oral QPM   buPROPion  300 mg Oral q morning   Chlorhexidine Gluconate Cloth  6 each Topical Daily   rosuvastatin  20 mg Oral QHS   Continuous Infusions:  sodium chloride 150 mL/hr at 07/10/21 0304      LOS: 2 days   Marinda Elk  [facility]  07/10/2021, 8:51 AM

## 2021-07-10 NOTE — Transfer of Care (Signed)
Immediate Anesthesia Transfer of Care Note  Patient: Angel Lawson  Procedure(s) Performed: LAPAROSCOPIC CHOLECYSTECTOMY SINGLE SITE WITH INTRAOPERATIVE CHOLANGIOGRAM AND PRIMARY UMBILICAL HERNIA REPAIR  Patient Location: PACU  Anesthesia Type:General  Level of Consciousness: awake, drowsy and patient cooperative  Airway & Oxygen Therapy: Patient Spontanous Breathing and Patient connected to face mask oxygen  Post-op Assessment: Report given to RN and Post -op Vital signs reviewed and stable  Post vital signs: Reviewed and stable  Last Vitals:  Vitals Value Taken Time  BP 139/79 07/10/21 1545  Temp 36.7 C 07/10/21 1541  Pulse 58 07/10/21 1547  Resp 17 07/10/21 1547  SpO2 100 % 07/10/21 1547  Vitals shown include unvalidated device data.  Last Pain:  Vitals:   07/10/21 1545  TempSrc:   PainSc: Asleep      Patients Stated Pain Goal: 3 (07/10/21 0951)  Complications: No notable events documented.

## 2021-07-10 NOTE — Op Note (Signed)
07/10/2021  PATIENT:  Angel Lawson  66 y.o. male  Patient Care Team: Eartha Inch, MD as PCP - General (Family Medicine)  PRE-OPERATIVE DIAGNOSIS:    Acute on Chronic Cholecystitis Gallstone pancreatitis  POST-OPERATIVE DIAGNOSIS:   Acute on Chronic Cholecystitis Gallstone pancreatitis Umbilical hernia  PROCEDURE:  SINGLE SITE Laparoscopic cholecystectomy with intraoperative cholangiogram (CPT code 97673) Primary medical hernia repair  SURGEON:  Ardeth Sportsman, MD, FACS.  ASSISTANT: OR Staff   ANESTHESIA:    General with endotracheal intubation Local anesthetic as a field block  EBL:  (See Anesthesia Intraoperative Record) Total I/O In: 1100 [I.V.:1000; IV Piggyback:100] Out: 50 [Blood:50]  Delay start of Pharmacological VTE agent (>24hrs) due to surgical blood loss or risk of bleeding:  no  DRAINS: None   SPECIMEN: Gallbladder    DISPOSITION OF SPECIMEN:  PATHOLOGY  COUNTS:  YES  PLAN OF CARE: Admit to inpatient   PATIENT DISPOSITION:  PACU - hemodynamically stable.  INDICATION: Pleasant patient found to have sharp epigastric pain and markedly elevated lipase.  Ultrasound with at least gallbladder polyp.  Symptoms off quickly.  Seen by gastrology and felt no need for ERCP.  Patient also has a cystic mass in the mid body of the pancreas.  Initially seemed to be larger compared to outside study but MRI/MRCP confirms the lesion seems relatively stable.  Discussed with my colleague, Dr. Sophronia Simas, with surgical oncology who agreed with the plan of follow-up study in 3-6 months to assess stability and discuss need for distal pancreatectomy in the future.  Because the pancreas she did not seem as acute and gallstone pancreatitis seen more probably etiology for his pancreatitis, we offered cholecystectomy  The anatomy & physiology of hepatobiliary & pancreatic function was discussed.  The pathophysiology of gallbladder dysfunction was discussed.  Natural history  risks without surgery was discussed.   I feel the risks of no intervention will lead to serious problems that outweigh the operative risks; therefore, I recommended cholecystectomy to remove the pathology.  I explained laparoscopic techniques with possible need for an open approach.  Probable cholangiogram to evaluate the bilary tract was explained as well.    Risks such as bleeding, infection, abscess, leak, injury to other organs, need for further treatment, heart attack, death, and other risks were discussed.  I noted a good likelihood this will help address the problem.  Possibility that this will not correct all abdominal symptoms was explained.  Goals of post-operative recovery were discussed as well.  We will work to minimize complications.  An educational handout further explaining the pathology and treatment options was given as well.  Questions were answered.  The patient expresses understanding & wishes to proceed with surgery.  OR FINDINGS: Mild gallbladder wall edema and thickening and changes consistent with at least chronic cholecystitis.  Cholangiogram seen classic biliary anatomy without any ductal dilatation.  No evidence of choledocholithiasis, stricture, nor leak.  Radiology agrees.  Liver: normal  7 mm hernia at the umbilical stalk.  Camera placed in and gallbladder extraction through this point and primarily repaired transversely.  DESCRIPTION:   The patient was identified & brought in the operating room. The patient was positioned supine with arms tucked. SCDs were active during the entire case. The patient underwent general anesthesia without any difficulty.  The abdomen was prepped and draped in a sterile fashion. A Surgical Timeout confirmed our plan.  I made a transverse curvilinear incision through the superior umbilical fold.  I placed a 44mm  long port through the supraumbilical fascia using a modified Hassan cutdown technique with umbilical stalk fascial countertraction. I  began carbon dioxide insufflation.  No change in end tidal CO2 measurement.   Camera inspection revealed no injury. There were no adhesions to the anterior abdominal wall supraumbilically.  I proceeded to continue with single site technique. I placed a #5 port in left upper aspect of the wound. I placed a 5 mm atraumatic grasper in the right inferior aspect of the wound.  I turned attention to the right upper quadrant.  Findings as noted above.  The gallbladder fundus was elevated cephalad. I freed adhesions to the ventral surface of the gallbladder off carefully.  I freed the peritoneal coverings between the gallbladder and the liver on the posteriolateral and anteriomedial walls. I alternated between Harmonic & blunt Maryland dissection to help get a good critical view of the cystic artery and cystic duct.  did further dissection to free 50%of the gallbladder off the liver bed to get a good critical view of the infundibulum and cystic duct. I dissected out the cystic artery; and, after getting a good 360 view, ligated the anterior & posterior branches of the cystic artery close on the infundibulum using the Harmonic ultrasonic dissection.  I skeletonized the cystic duct.  I placed a clip on the infundibulum. I did a partial cystic duct-otomy and ensured patency. I placed a 5 [place] cholangiocatheter through a puncture site at the right subcostal ridge of the abdominal wall and directed it into the cystic duct.  We ran a cholangiogram with dilute radio-opaque contrast and continuous fluoroscopy. Contrast flowed from a side branch consistent with cystic duct cannulization. Contrast flowed up the common hepatic duct into the right and left intrahepatic chains out to secondary radicals. Contrast flowed down the common bile duct easily across the normal ampulla into the duodenum.  This was consistent with a normal cholangiogram.  I removed the cholangiocatheter. I placed clips on the cystic duct x4.   I  completed cystic duct transection. I freed the gallbladder from its remaining attachments to the liver.  It was somewhat intrahepatic at the liver edge signed up transecting a wedge of liver to get a more clean resection.  I ensured hemostasis on the gallbladder fossa of the liver and elsewhere. I inspected the rest of the abdomen & detected no injury nor bleeding elsewhere.  I removed the gallbladder out the umbilical hernia at the base of the stalk.  I excised umbilical stalk off the fascia doing to better expose the umbilical hernia.  I primarily closed the umbilical hernia transversely using  #1 PDS interrupted stitches. I closed the skin using 4-0 monocryl stitch.  Sterile dressing was applied. The patient was extubated & arrived in the PACU in stable condition..  I had discussed postoperative care with the patient in the holding area. I discussed operative findings, updated the patient's status, discussed probable steps to recovery, and gave postoperative recommendations to the patient's spouse, Willam Munford.   Recommendations were made.  Questions were answered.  She expressed understanding & appreciation.  Ardeth Sportsman, M.D., F.A.C.S. Gastrointestinal and Minimally Invasive Surgery [facility] [address] [place], [place] [address] [phone] Main / Paging  07/10/2021 3:45 PM

## 2021-07-10 NOTE — Anesthesia Preprocedure Evaluation (Signed)
Anesthesia Evaluation  Patient identified by MRN, date of birth, ID band Patient awake    Reviewed: Allergy & Precautions, NPO status , Patient's Chart, lab work & pertinent test results  Airway Mallampati: II  TM Distance: >3 FB Neck ROM: Full    Dental  (+) Dental Advisory Given   Pulmonary neg pulmonary ROS,    breath sounds clear to auscultation       Cardiovascular + dysrhythmias  Rhythm:Regular Rate:Normal     Neuro/Psych negative neurological ROS     GI/Hepatic negative GI ROS, Neg liver ROS,   Endo/Other  negative endocrine ROS  Renal/GU negative Renal ROS     Musculoskeletal  (+) Arthritis ,   Abdominal   Peds  Hematology negative hematology ROS (+)   Anesthesia Other Findings   Reproductive/Obstetrics                             Lab Results  Component Value Date   WBC 4.9 07/09/2021   HGB 14.3 07/09/2021   HCT 43.2 07/09/2021   MCV 92.9 07/09/2021   PLT 206 07/09/2021   Lab Results  Component Value Date   CREATININE 0.84 07/10/2021   BUN 11 07/10/2021   NA 143 07/10/2021   K 3.9 07/10/2021   CL 114 (H) 07/10/2021   CO2 24 07/10/2021    Anesthesia Physical Anesthesia Plan  ASA: 2  Anesthesia Plan: General   Post-op Pain Management: Tylenol PO (pre-op)*, Gabapentin PO (pre-op)* and Toradol IV (intra-op)*   Induction: Intravenous  PONV Risk Score and Plan: 2 and Dexamethasone, Ondansetron and Treatment may vary due to age or medical condition  Airway Management Planned: Oral ETT  Additional Equipment:   Intra-op Plan:   Post-operative Plan: Extubation in OR  Informed Consent: I have reviewed the patients History and Physical, chart, labs and discussed the procedure including the risks, benefits and alternatives for the proposed anesthesia with the patient or authorized representative who has indicated his/her understanding and acceptance.     Dental  advisory given  Plan Discussed with:   Anesthesia Plan Comments:         Anesthesia Quick Evaluation

## 2021-07-10 NOTE — Discharge Instructions (Signed)
################################################################  LAPAROSCOPIC SURGERY: POST OP INSTRUCTIONS  ######################################################################  EAT Gradually transition to a high fiber diet with a fiber supplement over the next few weeks after discharge.  Start with a pureed / full liquid diet (see below)  WALK Walk an hour a day.  Control your pain to do that.    CONTROL PAIN Control pain so that you can walk, sleep, tolerate sneezing/coughing, go up/down stairs.  HAVE A BOWEL MOVEMENT DAILY Keep your bowels regular to avoid problems.  OK to try a laxative to override constipation.  OK to use an antidairrheal to slow down diarrhea.  Call if not better after 2 tries  CALL IF YOU HAVE PROBLEMS/CONCERNS Call if you are still struggling despite following these instructions. Call if you have concerns not answered by these instructions  ######################################################################    DIET: Follow a light bland diet & liquids the first 24 hours after arrival home, such as soup, liquids, starches, etc.  Be sure to drink plenty of fluids.  Quickly advance to a usual solid diet within a few days.  Avoid fast food or heavy meals as your are more likely to get nauseated or have irregular bowels.  A low-fat, high-fiber diet for the rest of your life is ideal.  Take your usually prescribed home medications unless otherwise directed.  PAIN CONTROL: Pain is best controlled by a usual combination of three different methods TOGETHER: Ice/Heat Over the counter pain medication Prescription pain medication Most patients will experience some swelling and bruising around the incisions.  Ice packs or heating pads (30-60 minutes up to 6 times a day) will help. Use ice for the first few days to help decrease swelling and bruising, then switch to heat to help relax tight/sore spots and speed recovery.  Some people prefer to use ice alone, heat  alone, alternating between ice & heat.  Experiment to what works for you.  Swelling and bruising can take several weeks to resolve.   It is helpful to take an over-the-counter pain medication regularly for the first few weeks.  Choose one of the following that works best for you: Naproxen (Aleve, etc)  Two 220mg tabs twice a day Ibuprofen (Advil, etc) Three 200mg tabs four times a day (every meal & bedtime) Acetaminophen (Tylenol, etc) 500-650mg four times a day (every meal & bedtime) A  prescription for pain medication (such as oxycodone, hydrocodone, tramadol, gabapentin, methocarbamol, etc) should be given to you upon discharge.  Take your pain medication as prescribed.  If you are having problems/concerns with the prescription medicine (does not control pain, nausea, vomiting, rash, itching, etc), please call us [phone] to see if we need to switch you to a different pain medicine that will work better for you and/or control your side effect better. If you need a refill on your pain medication, please give us 48 hour notice.  contact your pharmacy.  They will contact our office to request authorization. Prescriptions will not be filled after 5 pm or on week-ends  Avoid getting constipated.   Between the surgery and the pain medications, it is common to experience some constipation.   Increasing fluid intake and taking a fiber supplement (such as Metamucil, Citrucel, FiberCon, MiraLax, etc) 1-2 times a day regularly will usually help prevent this problem from occurring.   A mild laxative (prune juice, Milk of Magnesia, MiraLax, etc) should be taken according to package directions if there are no bowel movements after 48 hours.   Watch out for diarrhea.     If you have many loose bowel movements, simplify your diet to bland foods & liquids for a few days.   Stop any stool softeners and decrease your fiber supplement.   Switching to mild anti-diarrheal medications (Kayopectate, Pepto Bismol) can  help.   If this worsens or does not improve, please call us.  Wash / shower every day.  You may shower over the dressings as they are waterproof.  Continue to shower over incision(s) after the dressing is off.  It is good for closed incisions and even open wounds to be washed every day.  Shower every day.  Short baths are fine.  Wash the incisions and wounds clean with soap & water.    You may leave closed incisions open to air if it is dry.   You may cover the incision with clean gauze & replace it after your daily shower for comfort.  TEGADERM:  You have clear gauze band-aid dressings over your closed incision(s).  Remove the dressings 3 days after surgery.    ACTIVITIES as tolerated:   You may resume regular (light) daily activities beginning the next day--such as daily self-care, walking, climbing stairs--gradually increasing activities as tolerated.  If you can walk 30 minutes without difficulty, it is safe to try more intense activity such as jogging, treadmill, bicycling, low-impact aerobics, swimming, etc. Save the most intensive and strenuous activity for last such as sit-ups, heavy lifting, contact sports, etc  Refrain from any heavy lifting or straining until you are off narcotics for pain control.   DO NOT PUSH THROUGH PAIN.  Let pain be your guide: If it hurts to do something, don't do it.  Pain is your body warning you to avoid that activity for another week until the pain goes down. You may drive when you are no longer taking prescription pain medication, you can comfortably wear a seatbelt, and you can safely maneuver your car and apply brakes. You may have sexual intercourse when it is comfortable.  FOLLOW UP in our office Please call [facility] at [phone] to set up an appointment to see your surgeon in the office for a follow-up appointment approximately 2-3 weeks after your surgery. Make sure that you call for this appointment the day you arrive home to insure a convenient  appointment time.  10. IF YOU HAVE DISABILITY OR FAMILY LEAVE FORMS, BRING THEM TO THE OFFICE FOR PROCESSING.  DO NOT GIVE THEM TO YOUR DOCTOR.   WHEN TO CALL US [phone]: Poor pain control Reactions / problems with new medications (rash/itching, nausea, etc)  Fever over 101.5 F (38.5 C) Inability to urinate Nausea and/or vomiting Worsening swelling or bruising Continued bleeding from incision. Increased pain, redness, or drainage from the incision   The clinic staff is available to answer your questions during regular business hours (8:30am-5pm).  Please don't hesitate to call and ask to speak to one of our nurses for clinical concerns.   If you have a medical emergency, go to the nearest emergency room or call 911.  A surgeon from [facility] is always on call at the hospitals   [facility] [address], [address], [place], [place]  [address] ? MAIN: [phone] ? TOLL FREE: [phone] ?  FAX [phone] [email or web address]  ##############################################################  

## 2021-07-10 NOTE — Anesthesia Procedure Notes (Signed)
Procedure Name: Intubation Date/Time: 07/10/2021 2:20 PM Performed by: Yolonda Kida, CRNA Pre-anesthesia Checklist: Patient identified, Emergency Drugs available, Suction available and Patient being monitored Patient Re-evaluated:Patient Re-evaluated prior to induction Oxygen Delivery Method: Circle system utilized Preoxygenation: Pre-oxygenation with 100% oxygen Induction Type: IV induction Ventilation: Mask ventilation without difficulty and Oral airway inserted - appropriate to patient size Laryngoscope Size: Hyacinth Meeker and 2 Grade View: Grade II Tube type: Oral Tube size: 7.0 mm Number of attempts: 1 Airway Equipment and Method: Stylet and Oral airway Placement Confirmation: ETT inserted through vocal cords under direct vision, positive ETCO2 and breath sounds checked- equal and bilateral Secured at: 22 cm Tube secured with: Tape Dental Injury: Teeth and Oropharynx as per pre-operative assessment

## 2021-07-10 NOTE — Progress Notes (Signed)
  Transition of Care [facility]) Screening Note   Patient Details  Name: Angel Lawson Date of Birth: Dec 11, 1955   [facility]) CM/SW Contact:    Lennart Pall, LCSW Phone Number: 07/10/2021, 10:48 AM    Transition of Care Department [facility]) has reviewed patient and no TOC needs have been identified at this time. We will continue to monitor patient advancement through interdisciplinary progression rounds. If new patient transition needs arise, please place a TOC consult.

## 2021-07-10 NOTE — Interval H&P Note (Signed)
History and Physical Interval Note:  07/10/2021 1:56 PM  Angel Lawson  has presented today for surgery, with the diagnosis of gallstone pancreatitis.  The various methods of treatment have been discussed with the patient and family. After consideration of risks, benefits and other options for treatment, the patient has consented to  Procedure(s): [facility] CHOLANGIOGRAM (N/A) as a surgical intervention.  The patient's history has been reviewed, patient examined, no change in status, stable for surgery.  I have reviewed the patient's chart and labs.  Questions were answered to the patient's satisfaction.    I have re-reviewed the the patient's records, history, medications, and allergies.  I have re-examined the patient.  I again discussed intraoperative plans and goals of post-operative recovery.  The patient agrees to proceed.  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 No pertinent surgical history.  Social History   Socioeconomic History   Marital status: Married    Spouse name: Not on file   Number of children: Not on file   Years of education: Not on file   Highest education level: Not on file  Occupational History   Not on file  Tobacco Use   Smoking  status: Never   Smokeless tobacco: Never  Substance and Sexual Activity   Alcohol use: Never   Drug use: Never   Sexual activity: Not on file  Other Topics Concern   Not on file  Social History Narrative   Not on file   Social Determinants of Health   Financial Resource Strain: Not on file  Food Insecurity: Not on file  Transportation Needs: Not on file  Physical Activity: Not on file  Stress: Not on file  Social Connections: Not on file  Intimate Partner Violence: Not on file    History reviewed. No pertinent family history.  Medications Prior to Admission  Medication Sig Dispense Refill Last Dose   aspirin EC 81 MG tablet Take 81 mg by mouth every evening. Swallow whole.   Past Week   buPROPion (WELLBUTRIN XL) 300 MG 24 hr tablet Take 300 mg by mouth every morning.   Past Week   fluticasone (FLONASE) 50 MCG/ACT nasal spray Place 1 spray into both nostrils daily as needed (seasonal allergies).   Past Week   Multiple Vitamin (MULTIVITAMIN WITH MINERALS) TABS tablet Take 1 tablet by mouth every evening.   Past Week   rosuvastatin (CRESTOR) 20 MG tablet Take 20 mg by mouth at bedtime.   Past Week   tadalafil (CIALIS) 5 MG tablet Take 5 mg by mouth every morning.   Past Week   testosterone cypionate (DEPOTESTOSTERONE CYPIONATE) 200 MG/ML injection Inject 200 mg into the muscle every 21 ( twenty-one) days. Administered by Dr. Anastasia Pall   Past Month   ibuprofen (  ADVIL) 200 MG tablet Take 400-600 mg by mouth daily as needed for headache (pain).   More than a month    Current Facility-Administered Medications  Medication Dose Route Frequency Provider Last Rate Last Admin   0.9 %  sodium chloride infusion   Intravenous Continuous Michael Boston, MD 150 mL/hr at 07/10/21 0304 New Bag at 07/10/21 0304   [MAR Hold] acetaminophen (TYLENOL) tablet 650 mg  650 mg Oral Q6H PRN Reubin Milan, MD       Or   [facility] Hold] acetaminophen (TYLENOL) suppository 650 mg  650 mg Rectal Q6H PRN  Reubin Milan, MD       [facility] Hold] aspirin EC tablet 81 mg  81 mg Oral QPM Reubin Milan, MD   81 mg at 07/09/21 1758   bupivacaine liposome (EXPAREL) 1.3 % injection 266 mg  20 mL Infiltration Once Michael Boston, MD       [facility] Hold] buPROPion (WELLBUTRIN XL) 24 hr tablet 300 mg  300 mg Oral q morning Reubin Milan, MD   300 mg at 07/09/21 0944   cefTRIAXone (ROCEPHIN) 2 g in sodium chloride 0.9 % 100 mL IVPB  2 g Intravenous On Call to OR Michael Boston, MD       [facility] Hold] Chlorhexidine Gluconate Cloth 2 % PADS 6 each  6 each Topical Daily Reubin Milan, MD   6 each at 07/09/21 1149   Chlorhexidine Gluconate Cloth 2 % PADS 6 each  6 each Topical Once Michael Boston, MD       And   Chlorhexidine Gluconate Cloth 2 % PADS 6 each  6 each Topical Once Michael Boston, MD       Derrill Memo ON 07/11/2021] feeding supplement (ENSURE PRE-SURGERY) liquid 296 mL  296 mL Oral Once Michael Boston, MD       [facility] Hold] fentaNYL (SUBLIMAZE) injection 100 mcg  100 mcg Intravenous Q2H PRN Reubin Milan, MD       [facility] Hold] HYDROmorphone (DILAUDID) injection 1 mg  1 mg Intravenous Q3H PRN Reubin Milan, MD       lactated ringers infusion   Intravenous Continuous Suzette Battiest, MD   New Bag at 07/10/21 1242   [MAR Hold] ondansetron (ZOFRAN) tablet 4 mg  4 mg Oral Q6H PRN Reubin Milan, MD       Or   [facility] Hold] ondansetron [facility]) injection 4 mg  4 mg Intravenous Q6H PRN Reubin Milan, MD       [facility] Hold] rosuvastatin (CRESTOR) tablet 20 mg  20 mg Oral QHS Reubin Milan, MD   20 mg at 07/09/21 2026     No Known Allergies  BP 137/83   Pulse 78   Temp 98.5 F (36.9 C) (Oral)   Resp 18   Ht 5\' 9"  (1.753 m)   Wt 74.9 kg   SpO2 98%   BMI 24.38 kg/m   Labs: Results for orders placed or performed during the hospital encounter of 07/08/21 (from the past 48 hour(s))  HIV Antibody (routine testing w rflx)     Status: None   Collection Time: 07/09/21  2:51 AM   Result Value Ref Range   HIV Screen 4th Generation wRfx Non Reactive Non Reactive    Comment: Performed at [facility], [place], [place], [place] [address]  Comprehensive metabolic panel     Status: Abnormal   Collection Time: 07/09/21  2:51 AM  Result Value Ref Range  Sodium 141 135 - 145 mmol/L   Potassium 3.7 3.5 - 5.1 mmol/L   Chloride 112 (H) 98 - 111 mmol/L   CO2 24 22 - 32 mmol/L   Glucose, Bld 89 70 - 99 mg/dL    Comment: Glucose reference range applies only to samples taken after fasting for at least 8 hours.   BUN 9 8 - 23 mg/dL   Creatinine, Ser 0.93 0.61 - 1.24 mg/dL   Calcium 7.7 (L) 8.9 - 10.3 mg/dL   Total Protein 5.4 (L) 6.5 - 8.1 g/dL   Albumin 3.0 (L) 3.5 - 5.0 g/dL   AST 80 (H) 15 - 41 U/L   ALT 131 (H) 0 - 44 U/L   Alkaline Phosphatase 58 38 - 126 U/L   Total Bilirubin 1.2 0.3 - 1.2 mg/dL   GFR, Estimated >60 >60 mL/min    Comment: (NOTE) Calculated using the CKD-EPI Creatinine Equation (2021)    Anion gap 5 5 - 15    Comment: Performed at [facility], [place] [address] [place], [place] [address]  CBC     Status: None   Collection Time: 07/09/21  2:51 AM  Result Value Ref Range   WBC 4.9 4.0 - 10.5 K/uL   RBC 4.65 4.22 - 5.81 MIL/uL   Hemoglobin 14.3 13.0 - 17.0 g/dL   HCT 43.2 39.0 - 52.0 %   MCV 92.9 80.0 - 100.0 fL   MCH 30.8 26.0 - 34.0 pg   MCHC 33.1 30.0 - 36.0 g/dL   RDW 13.9 11.5 - 15.5 %   Platelets 206 150 - 400 K/uL   nRBC 0.0 0.0 - 0.2 %    Comment: Performed at [facility], [place] [address], [place], [place] [address]  Lipase, blood     Status: Abnormal   Collection Time: 07/09/21  2:51 AM  Result Value Ref Range   Lipase 172 (H) 11 - 51 U/L    Comment: Performed at [facility], [place] [address] [place], [place] [address]  Cancer antigen 19-9     Status: None   Collection Time: 07/09/21  2:24 PM  Result Value Ref Range   CA 19-9 <2 0 - 35 U/mL    Comment:  (NOTE) Roche [employer]ostics Electrochemiluminescence Immunoassay (ECLIA) Values obtained with different assay methods or kits cannot be used interchangeably.  Results cannot be interpreted as absolute evidence of the presence or absence of malignant disease. Performed At: [facility] [place], [place] [id number] Rush Farmer MD [id number]   Prealbumin     Status: Abnormal   Collection Time: 07/10/21  4:01 AM  Result Value Ref Range   Prealbumin 16.9 (L) 18 - 38 mg/dL    Comment: Performed at [facility], [place] [address] [place], [place] [address]  Comprehensive metabolic panel     Status: Abnormal   Collection Time: 07/10/21  4:01 AM  Result Value Ref Range   Sodium 143 135 - 145 mmol/L   Potassium 3.9 3.5 - 5.1 mmol/L   Chloride 114 (H) 98 - 111 mmol/L   CO2 24 22 - 32 mmol/L   Glucose, Bld 91 70 - 99 mg/dL    Comment: Glucose reference range applies only to samples taken after fasting for at least 8 hours.   BUN 11 8 - 23 mg/dL   Creatinine, Ser 0.84 0.61 - 1.24 mg/dL   Calcium 8.2 (L) 8.9 - 10.3 mg/dL   Total Protein 5.6 (  L) 6.5 - 8.1 g/dL   Albumin 3.2 (L) 3.5 - 5.0 g/dL   AST 40 15 - 41 U/L   ALT 94 (H) 0 - 44 U/L   Alkaline Phosphatase 60 38 - 126 U/L   Total Bilirubin 1.1 0.3 - 1.2 mg/dL   GFR, Estimated >60 >60 mL/min    Comment: (NOTE) Calculated using the CKD-EPI Creatinine Equation (2021)    Anion gap 5 5 - 15    Comment: Performed at [facility], [place] [address], [place], [place] [address]  Lipase, blood     Status: Abnormal   Collection Time: 07/10/21  4:01 AM  Result Value Ref Range   Lipase 72 (H) 11 - 51 U/L    Comment: Performed at [facility], [place] [address], [place], [place] [address]    Imaging / Studies: CT ABDOMEN PELVIS W CONTRAST  Result Date: 07/08/2021 CLINICAL DATA:  66 year old male with nausea vomiting, abdominal pain, pancreatic cystic mass in March. EXAM: CT ABDOMEN  AND PELVIS WITH CONTRAST TECHNIQUE: Multidetector CT imaging of the abdomen and pelvis was performed using the standard protocol following bolus administration of intravenous contrast. RADIATION DOSE REDUCTION: This exam was performed according to the departmental dose-optimization program which includes automated exposure control, adjustment of the mA and/or kV according to patient size and/or use of iterative reconstruction technique. CONTRAST:  161mL OMNIPAQUE IOHEXOL 300 MG/ML  SOLN COMPARISON:  CT chest, abdomen and Pelvis 04/25/2021. FINDINGS: Lower chest: Mild lung base atelectasis and/or scarring has not significantly changed. Cardiac size at the upper limits of normal. No pericardial or pleural effusion. Hepatobiliary: Gallbladder more distended today but otherwise appears normal. CBD is larger, 8 mm now versus 5 mm in March. The CBD appears to taper distally. Small 2-3 mm calcification in the duodenum on coronal image 53, which could be at the and few low. Mild new central hepatic duct prominence. Liver enhancement remains normal. Pancreas: Bilobed cystic mass of the pancreatic body measures about 16 x 20 x 45 mm (AP by transverse by CC) versus 16 x 11 x 28 mm in March. There is mild prominence of the main pancreatic duct. No pancreatic inflammation or interval atrophy identified. No peripancreatic lymphadenopathy identified. Spleen: Negative. Adrenals/Urinary Tract: Stable and essentially negative. Small benign appearing renal cysts (no follow-up imaging recommended). Stomach/Bowel: Redundant sigmoid colon tracks into the epigastrium. Gas and retained stool in the large bowel. Severe diverticulosis in the descending colon and at the junction with the sigmoid. No active inflammation identified. Normal appendix on series 2, image 100. No dilated small bowel. Cecum appears to beyond a lax mesentery. Fluid-filled stomach and proximal duodenum today. But no convincing gastroduodenal inflammation. No free air  or free fluid identified. Vascular/Lymphatic: Aortoiliac calcified atherosclerosis. Normal caliber abdominal aorta. Major arterial structures remain patent. Portal venous system is patent. No lymphadenopathy identified. Reproductive: Negative. Other: No pelvic free fluid. Musculoskeletal: Widespread lumbar vacuum disc and endplate spurring. No acute osseous abnormality identified. IMPRESSION: 1. Larger bilobed cystic mass of the pancreatic body since March, now up to 4.5 cm long axis (versus 3 cm previously). This is suspicious for a cystic Pancreatic Neoplasm, and there is associated mild prominence of the main pancreatic duct. Recommend further evaluation. No acute pancreatic inflammation or interval atrophy. 2. New dilatation of the CBD, with possible obstructing 2-3 mm stone at the ampulla. Query hyperbilirubinemia. 3. No other acute or inflammatory process identified in the abdomen or pelvis. Severe diverticulosis of the descending and sigmoid  colon without active inflammation. Aortic Atherosclerosis (ICD10-I70.0). Electronically Signed   By: Genevie Ann M.D.   On: 07/08/2021 04:55   MR 3D Recon At Scanner  Result Date: 07/09/2021 CLINICAL DATA:  Right upper quadrant pain. Enlarging cystic lesion within the pancreas. Evaluate for gallstone pancreatitis or common bile duct stone. EXAM: MRI ABDOMEN WITHOUT AND WITH CONTRAST (INCLUDING MRCP) TECHNIQUE: Multiplanar multisequence MR imaging of the abdomen was performed both before and after the administration of intravenous contrast. Heavily T2-weighted images of the biliary and pancreatic ducts were obtained, and three-dimensional MRCP images were rendered by post processing. CONTRAST:  67mL GADAVIST GADOBUTROL 1 MMOL/ML IV SOLN COMPARISON:  CT AP 07/08/2021.  Gallbladder sonogram 07/09/2021. FINDINGS: Lower chest: Trace right pleural fluid. Hepatobiliary: Mild hepatic steatosis. No suspicious liver lesion identified. No gallstones identified. No gallbladder wall  thickening or pericholecystic inflammation. The 5 mm gallbladder polyp is better seen on the ultrasound. No signs of bile duct dilatation. The common bile duct measures 6 mm proximally and tapers normally to the level of the ampulla. No sign choledocholithiasis. Pancreas: No significant pancreatic edema. No signs of main duct dilatation. Cystic lesion arising off the body of pancreas measures 2.1 x 1.4 by 3.1 cm, image 23/4 and image 13/3. This contains 2 thin internal areas septation. No enhancing mural nodule or thickened irregular septation associated with this structure. This is not significantly changed when compared with CT angio chest, abdomen and pelvis from 04/25/2021. Spleen:  Within normal limits in size and appearance. Adrenals/Urinary Tract: Normal adrenal glands. Bilateral benign appearing Bosniak class 1 and 2 kidney cysts are noted measuring up to 2.1 cm. No follow-up recommended. No signs of hydronephrosis. Stomach/Bowel: Visualized portions within the abdomen are unremarkable. Vascular/Lymphatic: No pathologically enlarged lymph nodes identified. No abdominal aortic aneurysm demonstrated. Other:  No significant free fluid or fluid collections identified. Musculoskeletal: No suspicious bone lesions identified. IMPRESSION: 1. No gallstones or signs of choledocholithiasis. No significant biliary ductal dilatation identified. 2. Mild hepatic steatosis 3. Cystic lesion within the body of pancreas has a maximum diameter of 3.1 cm. No signs enhancing mural nodularity or thickened enhancing septation. According to consensus criteria follow-up imaging in 6 months with repeat pancreas protocol MRI without and with contrast material is recommended. This recommendation follows ACR consensus guidelines: Management of Incidental Pancreatic Cysts: A White Paper of the ACR Incidental Findings Committee. [place] [id number]. Electronically Signed   By: Kerby Moors M.D.   On: 07/09/2021 20:35   MR  ABDOMEN MRCP W WO CONTAST  Result Date: 07/09/2021 CLINICAL DATA:  Right upper quadrant pain. Enlarging cystic lesion within the pancreas. Evaluate for gallstone pancreatitis or common bile duct stone. EXAM: MRI ABDOMEN WITHOUT AND WITH CONTRAST (INCLUDING MRCP) TECHNIQUE: Multiplanar multisequence MR imaging of the abdomen was performed both before and after the administration of intravenous contrast. Heavily T2-weighted images of the biliary and pancreatic ducts were obtained, and three-dimensional MRCP images were rendered by post processing. CONTRAST:  34mL GADAVIST GADOBUTROL 1 MMOL/ML IV SOLN COMPARISON:  CT AP 07/08/2021.  Gallbladder sonogram 07/09/2021. FINDINGS: Lower chest: Trace right pleural fluid. Hepatobiliary: Mild hepatic steatosis. No suspicious liver lesion identified. No gallstones identified. No gallbladder wall thickening or pericholecystic inflammation. The 5 mm gallbladder polyp is better seen on the ultrasound. No signs of bile duct dilatation. The common bile duct measures 6 mm proximally and tapers normally to the level of the ampulla. No sign choledocholithiasis. Pancreas: No significant pancreatic edema. No signs of main duct  dilatation. Cystic lesion arising off the body of pancreas measures 2.1 x 1.4 by 3.1 cm, image 23/4 and image 13/3. This contains 2 thin internal areas septation. No enhancing mural nodule or thickened irregular septation associated with this structure. This is not significantly changed when compared with CT angio chest, abdomen and pelvis from 04/25/2021. Spleen:  Within normal limits in size and appearance. Adrenals/Urinary Tract: Normal adrenal glands. Bilateral benign appearing Bosniak class 1 and 2 kidney cysts are noted measuring up to 2.1 cm. No follow-up recommended. No signs of hydronephrosis. Stomach/Bowel: Visualized portions within the abdomen are unremarkable. Vascular/Lymphatic: No pathologically enlarged lymph nodes identified. No abdominal aortic  aneurysm demonstrated. Other:  No significant free fluid or fluid collections identified. Musculoskeletal: No suspicious bone lesions identified. IMPRESSION: 1. No gallstones or signs of choledocholithiasis. No significant biliary ductal dilatation identified. 2. Mild hepatic steatosis 3. Cystic lesion within the body of pancreas has a maximum diameter of 3.1 cm. No signs enhancing mural nodularity or thickened enhancing septation. According to consensus criteria follow-up imaging in 6 months with repeat pancreas protocol MRI without and with contrast material is recommended. This recommendation follows ACR consensus guidelines: Management of Incidental Pancreatic Cysts: A White Paper of the ACR Incidental Findings Committee. [place] [id number]. Electronically Signed   By: Kerby Moors M.D.   On: 07/09/2021 20:35   US Abdomen Limited RUQ (LIVER/GB)  Result Date: 07/09/2021 CLINICAL DATA:  Pancreatitis. EXAM: ULTRASOUND ABDOMEN LIMITED RIGHT UPPER QUADRANT COMPARISON:  CT of Jul 08, 2021. FINDINGS: Gallbladder: No gallstones or wall thickening visualized. No sonographic Murphy sign noted by sonographer. 5 mm polyp is noted. Common bile duct: Diameter: 6 mm which is within normal limits. Liver: No focal lesion identified. Within normal limits in parenchymal echogenicity. Portal vein is patent on color Doppler imaging with normal direction of blood flow towards the liver. Other: None. IMPRESSION: Small gallbladder polyp is noted. No significant abnormality seen in the right upper quadrant of the abdomen. Electronically Signed   By: Marijo Conception M.D.   On: 07/09/2021 08:20     .Adin Hector, M.D., F.A.C.S. Gastrointestinal and Minimally Invasive Surgery [facility] [address], [place], [place] [address] [phone] Main / Paging  07/10/2021 1:56 PM    Adin Hector

## 2021-07-10 NOTE — Progress Notes (Signed)
[facility]ology Progress Note  Angel Lawson 66 y.o. 05-Jul-1955  Subjective: Seen and examined laying in bed.  Reports she is feeling well today.  Denies abdominal pain.  Patient to have cholecystectomy today.  MRCP with no findings of choledocholithiasis.  ROS : Review of Systems  Gastrointestinal:  Negative for abdominal pain, blood in stool, constipation, diarrhea, heartburn, melena, nausea and vomiting.  Genitourinary:  Negative for dysuria and urgency.     Objective: Vital signs in last 24 hours: Vitals:   07/10/21 0915 07/10/21 0948  BP: 136/82 132/76  Pulse: 66 (!) 59  Resp: 16 18  Temp: 98 F (36.7 C) 98.5 F (36.9 C)  SpO2: 97% 98%    Physical Exam:  General:  Alert, cooperative, no distress, appears stated age  Head:  Normocephalic, without obvious abnormality, atraumatic  Eyes:  Anicteric sclera, EOM's intact  Lungs:   Clear to auscultation bilaterally, respirations unlabored  Heart:  Regular rate and rhythm, S1, S2 normal  Abdomen:   Soft, non-tender, bowel sounds active all four quadrants,  no masses,   Extremities: Extremities normal, atraumatic, no  edema  Pulses: 2+ and symmetric    Lab Results: Recent Labs    07/09/21 0251 07/10/21 0401  NA 141 143  K 3.7 3.9  CL 112* 114*  CO2 24 24  GLUCOSE 89 91  BUN 9 11  CREATININE 0.93 0.84  CALCIUM 7.7* 8.2*   Recent Labs    07/09/21 0251 07/10/21 0401  AST 80* 40  ALT 131* 94*  ALKPHOS 58 60  BILITOT 1.2 1.1  PROT 5.4* 5.6*  ALBUMIN 3.0* 3.2*   Recent Labs    07/08/21 0237 07/09/21 0251  WBC 8.9 4.9  HGB 16.8 14.3  HCT 51.3 43.2  MCV 90.0 92.9  PLT 276 206   No results for input(s): LABPROT, INR in the last 72 hours.    Assessment Acute Biliary pancreatitis with pancreatic cyst   CT abdomen pelvis with contrast 07/08/2021 1. Larger bilobed cystic mass of the pancreatic body since March, now up to 4.5 cm long axis (versus 3 cm previously). there is associated mild  prominence of the main pancreatic duct. No acute pancreatic inflammation or interval atrophy. 2. New dilatation of the CBD, with possible obstructing 2-3 mm stone at the ampulla. Query hyperbilirubinemia. 3.Severe diverticulosis of the descending and sigmoid colon without active inflammation.   RUQ [place] 07/09/2021 Small gallbladder polyp (0.5 mm) is noted. No significant abnormality seen in the right upper quadrant of the abdomen.  MRCP 07/09/2021 1. No gallstones or signs of choledocholithiasis. No significant biliary ductal dilatation identified. 2. Mild hepatic steatosis 3. Cystic lesion within the body of pancreas has a maximum diameter of 3.1 cm. No signs enhancing mural nodularity or thickened enhancing septation.   Lipase greater than 10,000 on admission.   LFTs improving.  AST 226 on admission now 40.  ALT 217 on admission now 94.  T. bili 4.3 on admission now 1.1.    Abdominal pain greatly improved.  No gallstones seen on ultrasound or MRCP.  Possible gallstone pancreatitis with stone passed.  LFTs abdominal pain improving.   Plan: Due to supportive care Patient to have cholecystectomy today. We will follow surgical report with IOC for further evaluation of biliary stones. [facility] will follow.  Roseanne Reno Keondrick Dilks PA-C 07/10/2021, 12:59 PM  Contact #  [phone]

## 2021-07-11 ENCOUNTER — Encounter (HOSPITAL_COMMUNITY): Payer: Self-pay | Admitting: Surgery

## 2021-07-11 DIAGNOSIS — K862 Cyst of pancreas: Secondary | ICD-10-CM | POA: Diagnosis not present

## 2021-07-11 DIAGNOSIS — K8689 Other specified diseases of pancreas: Secondary | ICD-10-CM | POA: Diagnosis not present

## 2021-07-11 DIAGNOSIS — K851 Biliary acute pancreatitis without necrosis or infection: Secondary | ICD-10-CM

## 2021-07-11 DIAGNOSIS — K8050 Calculus of bile duct without cholangitis or cholecystitis without obstruction: Secondary | ICD-10-CM | POA: Diagnosis not present

## 2021-07-11 LAB — COMPREHENSIVE METABOLIC PANEL
ALT: 104 U/L — ABNORMAL HIGH (ref 0–44)
AST: 70 U/L — ABNORMAL HIGH (ref 15–41)
Albumin: 3.3 g/dL — ABNORMAL LOW (ref 3.5–5.0)
Alkaline Phosphatase: 55 U/L (ref 38–126)
Anion gap: 6 (ref 5–15)
BUN: 9 mg/dL (ref 8–23)
CO2: 25 mmol/L (ref 22–32)
Calcium: 8.2 mg/dL — ABNORMAL LOW (ref 8.9–10.3)
Chloride: 111 mmol/L (ref 98–111)
Creatinine, Ser: 0.91 mg/dL (ref 0.61–1.24)
GFR, Estimated: >60 mL/min (ref >60)
Glucose, Bld: 101 mg/dL — ABNORMAL HIGH (ref 70–99)
Potassium: 4.2 mmol/L (ref 3.5–5.1)
Sodium: 142 mmol/L (ref 135–145)
Total Bilirubin: 1 mg/dL (ref 0.3–1.2)
Total Protein: 5.7 g/dL — ABNORMAL LOW (ref 6.5–8.1)

## 2021-07-11 LAB — CBC
HCT: 44.2 % (ref 39.0–52.0)
Hemoglobin: 14.5 g/dL (ref 13.0–17.0)
MCH: 30 pg (ref 26.0–34.0)
MCHC: 32.8 g/dL (ref 30.0–36.0)
MCV: 91.3 fL (ref 80.0–100.0)
Platelets: 208 10*3/uL (ref 150–400)
RBC: 4.84 MIL/uL (ref 4.22–5.81)
RDW: 13.2 % (ref 11.5–15.5)
WBC: 8.3 10*3/uL (ref 4.0–10.5)
nRBC: 0 % (ref 0.0–0.2)

## 2021-07-11 LAB — MAGNESIUM: Magnesium: 2 mg/dL (ref 1.7–2.4)

## 2021-07-11 LAB — LIPASE, BLOOD: Lipase: 36 U/L (ref 11–51)

## 2021-07-11 NOTE — Progress Notes (Signed)
Patient ID: Angel Lawson, male   DOB: 03/31/1955, 66 y.o.   MRN: [id number]    1 Day Post-Op  Subjective: Feels well today.  Sore from surgery, but pain well controlled with ultram and tylenol.  No nausea.  Tolerating CLD so far.  ROS: See above, otherwise other systems negative  Objective: Vital signs in last 24 hours: Temp:  [97.8 F (36.6 C)-99.4 F (37.4 C)] 98.2 F (36.8 C) (05/24 0600) Pulse Rate:  [59-85] 69 (05/24 0600) Resp:  [15-21] 16 (05/24 0600) BP: (120-152)/(71-89) 124/81 (05/24 0600) SpO2:  [94 %-100 %] 97 % (05/24 0600) Weight:  [74.9 kg] 74.9 kg (05/23 0934) Last BM Date : 07/09/21  Intake/Output from previous day: 05/23 0701 - 05/24 0700 In: 2224 [P.O.:360; I.V.:1764; IV Piggyback:100] Out: 1050 [Urine:1000; Blood:50] Intake/Output this shift: No intake/output data recorded.  PE: Abd: soft, appropriately tender, +BS, ND, incision c/d/i  Lab Results:  Recent Labs    07/09/21 0251 07/11/21 0420  WBC 4.9 8.3  HGB 14.3 14.5  HCT 43.2 44.2  PLT 206 208   BMET Recent Labs    07/10/21 0401 07/11/21 0420  NA 143 142  K 3.9 4.2  CL 114* 111  CO2 24 25  GLUCOSE 91 101*  BUN 11 9  CREATININE 0.84 0.91  CALCIUM 8.2* 8.2*   PT/INR No results for input(s): LABPROT, INR in the last 72 hours. CMP     Component Value Date/Time   NA 142 07/11/2021 0420   K 4.2 07/11/2021 0420   CL 111 07/11/2021 0420   CO2 25 07/11/2021 0420   GLUCOSE 101 (H) 07/11/2021 0420   BUN 9 07/11/2021 0420   CREATININE 0.91 07/11/2021 0420   CALCIUM 8.2 (L) 07/11/2021 0420   PROT 5.7 (L) 07/11/2021 0420   ALBUMIN 3.3 (L) 07/11/2021 0420   AST 70 (H) 07/11/2021 0420   ALT 104 (H) 07/11/2021 0420   ALKPHOS 55 07/11/2021 0420   BILITOT 1.0 07/11/2021 0420   GFRNONAA >60 07/11/2021 0420   Lipase     Component Value Date/Time   LIPASE 36 07/11/2021 0420       Studies/Results: DG Cholangiogram Operative  Result Date: 07/10/2021 CLINICAL DATA:   Cholecystectomy for gallstone pancreatitis. EXAM: INTRAOPERATIVE CHOLANGIOGRAM TECHNIQUE: Cholangiographic images from the C-arm fluoroscopic device were submitted for interpretation post-operatively. Please see the procedural report for the amount of contrast and the fluoroscopy time utilized. FLUOROSCOPY: Radiation Exposure Index (as provided by the fluoroscopic device): 3.8 mGy Kerma COMPARISON:  MRI/MRCP on 07/09/2021 FINDINGS: Intraoperative imaging with a C-arm demonstrates normal opacified cystic duct stump, common bile duct and visualized intrahepatic ducts. No evidence of biliary dilatation, stricture, filling defect or contrast extravasation. Contrast enters the duodenum normally. IMPRESSION: Normal intraoperative cholangiogram. Electronically Signed   By: Aletta Edouard M.D.   On: 07/10/2021 15:20   MR 3D Recon At Scanner  Result Date: 07/09/2021 CLINICAL DATA:  Right upper quadrant pain. Enlarging cystic lesion within the pancreas. Evaluate for gallstone pancreatitis or common bile duct stone. EXAM: MRI ABDOMEN WITHOUT AND WITH CONTRAST (INCLUDING MRCP) TECHNIQUE: Multiplanar multisequence MR imaging of the abdomen was performed both before and after the administration of intravenous contrast. Heavily T2-weighted images of the biliary and pancreatic ducts were obtained, and three-dimensional MRCP images were rendered by post processing. CONTRAST:  72mL GADAVIST GADOBUTROL 1 MMOL/ML IV SOLN COMPARISON:  CT AP 07/08/2021.  Gallbladder sonogram 07/09/2021. FINDINGS: Lower chest: Trace right pleural fluid. Hepatobiliary: Mild hepatic steatosis. No suspicious liver lesion  identified. No gallstones identified. No gallbladder wall thickening or pericholecystic inflammation. The 5 mm gallbladder polyp is better seen on the ultrasound. No signs of bile duct dilatation. The common bile duct measures 6 mm proximally and tapers normally to the level of the ampulla. No sign choledocholithiasis. Pancreas: No  significant pancreatic edema. No signs of main duct dilatation. Cystic lesion arising off the body of pancreas measures 2.1 x 1.4 by 3.1 cm, image 23/4 and image 13/3. This contains 2 thin internal areas septation. No enhancing mural nodule or thickened irregular septation associated with this structure. This is not significantly changed when compared with CT angio chest, abdomen and pelvis from 04/25/2021. Spleen:  Within normal limits in size and appearance. Adrenals/Urinary Tract: Normal adrenal glands. Bilateral benign appearing Bosniak class 1 and 2 kidney cysts are noted measuring up to 2.1 cm. No follow-up recommended. No signs of hydronephrosis. Stomach/Bowel: Visualized portions within the abdomen are unremarkable. Vascular/Lymphatic: No pathologically enlarged lymph nodes identified. No abdominal aortic aneurysm demonstrated. Other:  No significant free fluid or fluid collections identified. Musculoskeletal: No suspicious bone lesions identified. IMPRESSION: 1. No gallstones or signs of choledocholithiasis. No significant biliary ductal dilatation identified. 2. Mild hepatic steatosis 3. Cystic lesion within the body of pancreas has a maximum diameter of 3.1 cm. No signs enhancing mural nodularity or thickened enhancing septation. According to consensus criteria follow-up imaging in 6 months with repeat pancreas protocol MRI without and with contrast material is recommended. This recommendation follows ACR consensus guidelines: Management of Incidental Pancreatic Cysts: A White Paper of the ACR Incidental Findings Committee. [place] [id number]. Electronically Signed   By: Kerby Moors M.D.   On: 07/09/2021 20:35   MR ABDOMEN MRCP W WO CONTAST  Result Date: 07/09/2021 CLINICAL DATA:  Right upper quadrant pain. Enlarging cystic lesion within the pancreas. Evaluate for gallstone pancreatitis or common bile duct stone. EXAM: MRI ABDOMEN WITHOUT AND WITH CONTRAST (INCLUDING MRCP) TECHNIQUE:  Multiplanar multisequence MR imaging of the abdomen was performed both before and after the administration of intravenous contrast. Heavily T2-weighted images of the biliary and pancreatic ducts were obtained, and three-dimensional MRCP images were rendered by post processing. CONTRAST:  59mL GADAVIST GADOBUTROL 1 MMOL/ML IV SOLN COMPARISON:  CT AP 07/08/2021.  Gallbladder sonogram 07/09/2021. FINDINGS: Lower chest: Trace right pleural fluid. Hepatobiliary: Mild hepatic steatosis. No suspicious liver lesion identified. No gallstones identified. No gallbladder wall thickening or pericholecystic inflammation. The 5 mm gallbladder polyp is better seen on the ultrasound. No signs of bile duct dilatation. The common bile duct measures 6 mm proximally and tapers normally to the level of the ampulla. No sign choledocholithiasis. Pancreas: No significant pancreatic edema. No signs of main duct dilatation. Cystic lesion arising off the body of pancreas measures 2.1 x 1.4 by 3.1 cm, image 23/4 and image 13/3. This contains 2 thin internal areas septation. No enhancing mural nodule or thickened irregular septation associated with this structure. This is not significantly changed when compared with CT angio chest, abdomen and pelvis from 04/25/2021. Spleen:  Within normal limits in size and appearance. Adrenals/Urinary Tract: Normal adrenal glands. Bilateral benign appearing Bosniak class 1 and 2 kidney cysts are noted measuring up to 2.1 cm. No follow-up recommended. No signs of hydronephrosis. Stomach/Bowel: Visualized portions within the abdomen are unremarkable. Vascular/Lymphatic: No pathologically enlarged lymph nodes identified. No abdominal aortic aneurysm demonstrated. Other:  No significant free fluid or fluid collections identified. Musculoskeletal: No suspicious bone lesions identified. IMPRESSION: 1. No gallstones or  signs of choledocholithiasis. No significant biliary ductal dilatation identified. 2. Mild hepatic  steatosis 3. Cystic lesion within the body of pancreas has a maximum diameter of 3.1 cm. No signs enhancing mural nodularity or thickened enhancing septation. According to consensus criteria follow-up imaging in 6 months with repeat pancreas protocol MRI without and with contrast material is recommended. This recommendation follows ACR consensus guidelines: Management of Incidental Pancreatic Cysts: A White Paper of the ACR Incidental Findings Committee. [place] [id number]. Electronically Signed   By: Kerby Moors M.D.   On: 07/09/2021 20:35    Anti-infectives: Anti-infectives (From admission, onward)    Start     Dose/Rate Route Frequency Ordered Stop   07/10/21 1130  cefTRIAXone (ROCEPHIN) 2 g in sodium chloride 0.9 % 100 mL IVPB        2 g 200 mL/hr over 30 Minutes Intravenous On call to O.R. 07/10/21 0918 07/10/21 1422        Assessment/Plan POD 1, s/p lap chole for biliary pancreatitis, Dr. Johney Maine 5/23 -doing great today -HH diet -stable surgically for DC home, d/w primary service  Pancreatic cystic lesion -he will follow up with Dr. Zenia Resides in 4 weeks for post op visit but also for further follow up and management of this lesion. -patient knows to get a CD copy of his MRI from [place] and bring it with him to his appointment.   FEN - HH diet VTE - SCDs ID - none    LOS: 3 days    Henreitta Cea , [facility] Surgery 07/11/2021, 8:28 AM Please see Amion for pager number during day hours 7:00am-4:30pm or 7:00am -11:30am on weekends

## 2021-07-11 NOTE — Discharge Summary (Signed)
Physician Discharge Summary   Patient: Angel Lawson MRN: [id number] DOB: 1955/11/30  Admit date:     07/08/2021  Discharge date: 07/11/21  Discharge Physician: Meredeth Ide   PCP: Eartha Inch, MD   Recommendations at discharge:   Reported about rebound  Discharge Diagnoses: Principal Problem:   Gallstone pancreatitis Active Problems:   Cystic mass of pancreas   Hypercholesteremia   Generalized anxiety disorder   Abnormal LFTs   Elevated lipase   Gallbladder polyp   Common bile duct calculi - possible  Resolved Problems:   Acute biliary pancreatitis  Hospital Course: 66 y.o. male past medical history significant for anxiety diagnosed with a pancreatic cyst in early March of this year by CTA comes in complaining of severe upper abdominal pain radiating to her chest and nausea vitals are stable, her lipase was greater than 10,000 complete metabolic panel was unremarkable bilirubin of 4.3 elevated LFTs CT scan of the abdomen and pelvis showed large bilobed cystic mass of the pancreatic body since March up to 4.5 cm with suspicion for cystic pancreatic neoplasm with a new dilated common bile duct stone measuring 2 to 3 mm in the ampulla severe diverticulosis no active inflammation.    Assessment and Plan:  Acute biliary pancreatitis with pancreatic cyst -Patient was started on IV fluids, clear liquid diet and narcotics. -General surgery was consulted recommended MRCP which showed no signs of gallstone in the common bile duct, also showed mild hepatic steatosis.  It showed 3.1 cm cystic mass which is stable compared to prior. -Patient underwent laparoscopic cholecystectomy with intraoperative cholangiogram yesterday -General surgery has cleared the patient for discharge. -General surgery will follow-up in the clinic  Pancreatic cyst -Gastroenterology was consulted -Plan to see patient in the clinic in case he needs endoscopic ultrasound  Hyperlipidemia -Continue  statins  Generalized anxiety disorder -Continue Wellbutrin      Consultants: General surgery, gastroenterology Procedures performed: Cholecystectomy Disposition: Home Diet recommendation:  Discharge Diet Orders (From admission, onward)     Start     Ordered   07/10/21 0000  Diet - low sodium heart healthy       Comments: Start with a bland diet such as soups, liquids, starchy foods, low fat foods, etc. the first few days at home. Gradually advance to a solid, low-fat, high fiber diet by the end of the first week at home.   Add a fiber supplement to your diet (Metamucil, etc) If you feel full, bloated, or constipated, stay on a full liquid or pureed/blenderized diet for a few days until you feel better and are no longer constipated.   07/10/21 1542           Regular diet DISCHARGE MEDICATION: Allergies as of 07/11/2021   No Known Allergies      Medication List     TAKE these medications    aspirin EC 81 MG tablet Take 81 mg by mouth every evening. Swallow whole.   buPROPion 300 MG 24 hr tablet Commonly known as: WELLBUTRIN XL Take 300 mg by mouth every morning.   fluticasone 50 MCG/ACT nasal spray Commonly known as: FLONASE Place 1 spray into both nostrils daily as needed (seasonal allergies).   ibuprofen 200 MG tablet Commonly known as: ADVIL Take 400-600 mg by mouth daily as needed for headache (pain).   multivitamin with minerals Tabs tablet Take 1 tablet by mouth every evening.   rosuvastatin 20 MG tablet Commonly known as: CRESTOR Take 20 mg by mouth at  bedtime.   tadalafil 5 MG tablet Commonly known as: CIALIS Take 5 mg by mouth every morning.   testosterone cypionate 200 MG/ML injection Commonly known as: DEPOTESTOSTERONE CYPIONATE Inject 200 mg into the muscle every 21 ( twenty-one) days. Administered by Dr. Antony HasteMichael Badger   traMADol 50 MG tablet Commonly known as: ULTRAM Take 1-2 tablets (50-100 mg total) by mouth every 6 (six) hours as  needed for moderate pain or severe pain.               Discharge Care Instructions  (From admission, onward)           Start     Ordered   07/10/21 0000  Discharge wound care:       Comments: It is good for closed incisions and even open wounds to be washed every day.  Shower every day.  Short baths are fine.  Wash the incisions and wounds clean with soap & water.    You may leave closed incisions open to air if it is dry.   You may cover the incision with clean gauze & replace it after your daily shower for comfort.  {You have skin tapes called Steristrips on your incisions.  Leave them in place, and they will fall off on their own like a scab in 1-3 weeks.  You may trim any edges that curl up with clean scissors.  You may remove them in the shower in a week.  You have purple skin glue (Dermabond) on your incision(s).  Leave them in place, and they will fall off on their own like a scab in 2-3 weeks.  You may trim any edges that curl up with clean scissors.","STAPLES: You have skin staples.  Leave them in place & set up an appointment for them to be removed by a surgery office nurse ~10 days after surgery.","DRAIN:  You have a drain in place.  Every day change the dressing in the shower, wash around the skin exit site with soap & water and place a new dressing of gauze or band aid around the skin every day.  Keep the drain site clean & dry.  Follow up in our surgery office to discuss plans for drain removal in the near future", You have  clear gauze  b   07/10/21 1542            Follow-up Information     Fritzi MandesAllen, Shelby L, MD Follow up in 1 month(s).   Specialty: General Surgery Why: our office is working on your follow up.  please call to confirm this appointment.  you will need to arrive 30 minutes prior to your appointment for paperwork and check in process Contact information: [address] [place] [address] [phone]         Vida RiggerMagod, Marc, MD. Schedule  an appointment as soon as possible for a visit.   Specialty: Gastroenterology Contact information: [address] [place] [address] [phone]                Discharge Exam: Ceasar MonsFiled Weights   07/08/21 0150 07/08/21 1007 07/10/21 0934  Weight: 74.8 kg 74.9 kg 74.9 kg   General-appears in no acute distress Heart-S1-S2, regular, no murmur auscultated Lungs-clear to auscultation bilaterally, no wheezing or crackles auscultated Abdomen-soft, nontender, no organomegaly Extremities-no edema in the lower extremities Neuro-alert, oriented x3, no focal deficit noted  Condition at discharge: good  The results of significant diagnostics from this hospitalization (including imaging,  microbiology, ancillary and laboratory) are listed below for reference.   Imaging Studies: DG Cholangiogram Operative  Result Date: 07/10/2021 CLINICAL DATA:  Cholecystectomy for gallstone pancreatitis. EXAM: INTRAOPERATIVE CHOLANGIOGRAM TECHNIQUE: Cholangiographic images from the C-arm fluoroscopic device were submitted for interpretation post-operatively. Please see the procedural report for the amount of contrast and the fluoroscopy time utilized. FLUOROSCOPY: Radiation Exposure Index (as provided by the fluoroscopic device): 3.8 mGy Kerma COMPARISON:  MRI/MRCP on 07/09/2021 FINDINGS: Intraoperative imaging with a C-arm demonstrates normal opacified cystic duct stump, common bile duct and visualized intrahepatic ducts. No evidence of biliary dilatation, stricture, filling defect or contrast extravasation. Contrast enters the duodenum normally. IMPRESSION: Normal intraoperative cholangiogram. Electronically Signed   By: Irish Lack M.D.   On: 07/10/2021 15:20   CT ABDOMEN PELVIS W CONTRAST  Result Date: 07/08/2021 CLINICAL DATA:  66 year old male with nausea vomiting, abdominal pain, pancreatic cystic mass in March. EXAM: CT ABDOMEN AND PELVIS WITH CONTRAST TECHNIQUE: Multidetector CT  imaging of the abdomen and pelvis was performed using the standard protocol following bolus administration of intravenous contrast. RADIATION DOSE REDUCTION: This exam was performed according to the departmental dose-optimization program which includes automated exposure control, adjustment of the mA and/or kV according to patient size and/or use of iterative reconstruction technique. CONTRAST:  OMNIPAQUE IOHEXOL 300 MG/ML  SOLN COMPARISON:  CT chest, abdomen and Pelvis 04/25/2021. FINDINGS: Lower chest: Mild lung base atelectasis and/or scarring has not significantly changed. Cardiac size at the upper limits of normal. No pericardial or pleural effusion. Hepatobiliary: Gallbladder more distended today but otherwise appears normal. CBD is larger, 8 mm now versus 5 mm in March. The CBD appears to taper distally. Small 2-3 mm calcification in the duodenum on coronal image 53, which could be at the and few low. Mild new central hepatic duct prominence. Liver enhancement remains normal. Pancreas: Bilobed cystic mass of the pancreatic body measures about 16 x 20 x 45 mm (AP by transverse by CC) versus 16 x 11 x 28 mm in March. There is mild prominence of the main pancreatic duct. No pancreatic inflammation or interval atrophy identified. No peripancreatic lymphadenopathy identified. Spleen: Negative. Adrenals/Urinary Tract: Stable and essentially negative. Small benign appearing renal cysts (no follow-up imaging recommended). Stomach/Bowel: Redundant sigmoid colon tracks into the epigastrium. Gas and retained stool in the large bowel. Severe diverticulosis in the descending colon and at the junction with the sigmoid. No active inflammation identified. Normal appendix on series 2, image 100. No dilated small bowel. Cecum appears to beyond a lax mesentery. Fluid-filled stomach and proximal duodenum today. But no convincing gastroduodenal inflammation. No free air or free fluid identified. Vascular/Lymphatic:  Aortoiliac calcified atherosclerosis. Normal caliber abdominal aorta. Major arterial structures remain patent. Portal venous system is patent. No lymphadenopathy identified. Reproductive: Negative. Other: No pelvic free fluid. Musculoskeletal: Widespread lumbar vacuum disc and endplate spurring. No acute osseous abnormality identified. IMPRESSION: 1. Larger bilobed cystic mass of the pancreatic body since March, now up to 4.5 cm long axis (versus 3 cm previously). This is suspicious for a cystic Pancreatic Neoplasm, and there is associated mild prominence of the main pancreatic duct. Recommend further evaluation. No acute pancreatic inflammation or interval atrophy. 2. New dilatation of the CBD, with possible obstructing 2-3 mm stone at the ampulla. Query hyperbilirubinemia. 3. No other acute or inflammatory process identified in the abdomen or pelvis. Severe diverticulosis of the descending and sigmoid colon without active inflammation. Aortic Atherosclerosis (ICD10-I70.0). Electronically Signed   By: Althea Grimmer.D.  On: 07/08/2021 04:55   MR 3D Recon At Scanner  Result Date: 07/09/2021 CLINICAL DATA:  Right upper quadrant pain. Enlarging cystic lesion within the pancreas. Evaluate for gallstone pancreatitis or common bile duct stone. EXAM: MRI ABDOMEN WITHOUT AND WITH CONTRAST (INCLUDING MRCP) TECHNIQUE: Multiplanar multisequence MR imaging of the abdomen was performed both before and after the administration of intravenous contrast. Heavily T2-weighted images of the biliary and pancreatic ducts were obtained, and three-dimensional MRCP images were rendered by post processing. CONTRAST:  7mL GADAVIST GADOBUTROL 1 MMOL/ML IV SOLN COMPARISON:  CT AP 07/08/2021.  Gallbladder sonogram 07/09/2021. FINDINGS: Lower chest: Trace right pleural fluid. Hepatobiliary: Mild hepatic steatosis. No suspicious liver lesion identified. No gallstones identified. No gallbladder wall thickening or pericholecystic inflammation. The  5 mm gallbladder polyp is better seen on the ultrasound. No signs of bile duct dilatation. The common bile duct measures 6 mm proximally and tapers normally to the level of the ampulla. No sign choledocholithiasis. Pancreas: No significant pancreatic edema. No signs of main duct dilatation. Cystic lesion arising off the body of pancreas measures 2.1 x 1.4 by 3.1 cm, image 23/4 and image 13/3. This contains 2 thin internal areas septation. No enhancing mural nodule or thickened irregular septation associated with this structure. This is not significantly changed when compared with CT angio chest, abdomen and pelvis from 04/25/2021. Spleen:  Within normal limits in size and appearance. Adrenals/Urinary Tract: Normal adrenal glands. Bilateral benign appearing Bosniak class 1 and 2 kidney cysts are noted measuring up to 2.1 cm. No follow-up recommended. No signs of hydronephrosis. Stomach/Bowel: Visualized portions within the abdomen are unremarkable. Vascular/Lymphatic: No pathologically enlarged lymph nodes identified. No abdominal aortic aneurysm demonstrated. Other:  No significant free fluid or fluid collections identified. Musculoskeletal: No suspicious bone lesions identified. IMPRESSION: 1. No gallstones or signs of choledocholithiasis. No significant biliary ductal dilatation identified. 2. Mild hepatic steatosis 3. Cystic lesion within the body of pancreas has a maximum diameter of 3.1 cm. No signs enhancing mural nodularity or thickened enhancing septation. According to consensus criteria follow-up imaging in 6 months with repeat pancreas protocol MRI without and with contrast material is recommended. This recommendation follows ACR consensus guidelines: Management of Incidental Pancreatic Cysts: A White Paper of the ACR Incidental Findings Committee. J Am Coll Radiol 2017;14:911-923. Electronically Signed   By: Signa Kell M.D.   On: 07/09/2021 20:35   MR ABDOMEN MRCP W WO CONTAST  Result Date:  07/09/2021 CLINICAL DATA:  Right upper quadrant pain. Enlarging cystic lesion within the pancreas. Evaluate for gallstone pancreatitis or common bile duct stone. EXAM: MRI ABDOMEN WITHOUT AND WITH CONTRAST (INCLUDING MRCP) TECHNIQUE: Multiplanar multisequence MR imaging of the abdomen was performed both before and after the administration of intravenous contrast. Heavily T2-weighted images of the biliary and pancreatic ducts were obtained, and three-dimensional MRCP images were rendered by post processing. CONTRAST:  7mL GADAVIST GADOBUTROL 1 MMOL/ML IV SOLN COMPARISON:  CT AP 07/08/2021.  Gallbladder sonogram 07/09/2021. FINDINGS: Lower chest: Trace right pleural fluid. Hepatobiliary: Mild hepatic steatosis. No suspicious liver lesion identified. No gallstones identified. No gallbladder wall thickening or pericholecystic inflammation. The 5 mm gallbladder polyp is better seen on the ultrasound. No signs of bile duct dilatation. The common bile duct measures 6 mm proximally and tapers normally to the level of the ampulla. No sign choledocholithiasis. Pancreas: No significant pancreatic edema. No signs of main duct dilatation. Cystic lesion arising off the body of pancreas measures 2.1 x 1.4 by 3.1 cm, image 23/4  and image 13/3. This contains 2 thin internal areas septation. No enhancing mural nodule or thickened irregular septation associated with this structure. This is not significantly changed when compared with CT angio chest, abdomen and pelvis from 04/25/2021. Spleen:  Within normal limits in size and appearance. Adrenals/Urinary Tract: Normal adrenal glands. Bilateral benign appearing Bosniak class 1 and 2 kidney cysts are noted measuring up to 2.1 cm. No follow-up recommended. No signs of hydronephrosis. Stomach/Bowel: Visualized portions within the abdomen are unremarkable. Vascular/Lymphatic: No pathologically enlarged lymph nodes identified. No abdominal aortic aneurysm demonstrated. Other:  No  significant free fluid or fluid collections identified. Musculoskeletal: No suspicious bone lesions identified. IMPRESSION: 1. No gallstones or signs of choledocholithiasis. No significant biliary ductal dilatation identified. 2. Mild hepatic steatosis 3. Cystic lesion within the body of pancreas has a maximum diameter of 3.1 cm. No signs enhancing mural nodularity or thickened enhancing septation. According to consensus criteria follow-up imaging in 6 months with repeat pancreas protocol MRI without and with contrast material is recommended. This recommendation follows ACR consensus guidelines: Management of Incidental Pancreatic Cysts: A White Paper of the ACR Incidental Findings Committee. J Am Coll Radiol 2017;14:911-923. Electronically Signed   By: Signa Kell M.D.   On: 07/09/2021 20:35   US Abdomen Limited RUQ (LIVER/GB)  Result Date: 07/09/2021 CLINICAL DATA:  Pancreatitis. EXAM: ULTRASOUND ABDOMEN LIMITED RIGHT UPPER QUADRANT COMPARISON:  CT of Jul 08, 2021. FINDINGS: Gallbladder: No gallstones or wall thickening visualized. No sonographic Murphy sign noted by sonographer. 5 mm polyp is noted. Common bile duct: Diameter: 6 mm which is within normal limits. Liver: No focal lesion identified. Within normal limits in parenchymal echogenicity. Portal vein is patent on color Doppler imaging with normal direction of blood flow towards the liver. Other: None. IMPRESSION: Small gallbladder polyp is noted. No significant abnormality seen in the right upper quadrant of the abdomen. Electronically Signed   By: Lupita Raider M.D.   On: 07/09/2021 08:20    Microbiology: Results for orders placed or performed during the hospital encounter of 07/08/21  MRSA Next Gen by PCR, Nasal     Status: None   Collection Time: 07/08/21 11:03 AM   Specimen: Nasal Mucosa; Nasal Swab  Result Value Ref Range Status   MRSA by PCR Next Gen NOT DETECTED NOT DETECTED Final    Comment: (NOTE) The GeneXpert MRSA Assay (FDA  approved for NASAL specimens only), is one component of a comprehensive MRSA colonization surveillance program. It is not intended to diagnose MRSA infection nor to guide or monitor treatment for MRSA infections. Test performance is not FDA approved in patients less than 32 years old. Performed at [facility], [address], [place], [place] [address]     Labs: CBC: Recent Labs  Lab 07/08/21 0237 07/09/21 0251 07/11/21 0420  WBC 8.9 4.9 8.3  HGB 16.8 14.3 14.5  HCT 51.3 43.2 44.2  MCV 90.0 92.9 91.3  PLT 276 206 208   Basic Metabolic Panel: Recent Labs  Lab 07/08/21 0237 07/09/21 0251 07/10/21 0401 07/11/21 0420  NA 142 141 143 142  K 3.7 3.7 3.9 4.2  CL 104 112* 114* 111  CO2 GLUCOSE 121* 89 91 101*  BUN CREATININE 1.06 0.93 0.84 0.91  CALCIUM 9.5 7.7* 8.2* 8.2*  MG  --   --   --  2.0   Liver Function Tests: Recent Labs  Lab 07/08/21 0237 07/09/21 0251 07/10/21  0401 07/11/21 0420  AST 226* 80* 40 70*  ALT 217* 131* 94* 104*  ALKPHOS 74 58 60 55  BILITOT 4.3* 1.2 1.1 1.0  PROT 7.2 5.4* 5.6* 5.7*  ALBUMIN 4.5 3.0* 3.2* 3.3*   CBG: Recent Labs  Lab 07/08/21 1014  GLUCAP 122*    Discharge time spent: greater than 30 minutes.  Signed: Meredeth Ide, MD [facility] 07/11/2021

## 2021-07-11 NOTE — Care Management Important Message (Signed)
Important Message  Patient Details IM Letter given to the Patient. Name: Angel Lawson MRN: [id number] Date of Birth: 1955-10-09   Medicare Important Message Given:  Yes     Caren Macadam 07/11/2021, 10:01 AM

## 2021-07-12 LAB — SURGICAL PATHOLOGY

## 2021-07-12 NOTE — Anesthesia Postprocedure Evaluation (Signed)
Anesthesia Post Note  Patient: Angel Lawson  Procedure(s) Performed: LAPAROSCOPIC CHOLECYSTECTOMY SINGLE SITE WITH INTRAOPERATIVE CHOLANGIOGRAM AND PRIMARY UMBILICAL HERNIA REPAIR     Patient location during evaluation: PACU Anesthesia Type: General Level of consciousness: awake and alert Pain management: pain level controlled Vital Signs Assessment: post-procedure vital signs reviewed and stable Respiratory status: spontaneous breathing, nonlabored ventilation, respiratory function stable and patient connected to nasal cannula oxygen Cardiovascular status: blood pressure returned to baseline and stable Postop Assessment: no apparent nausea or vomiting Anesthetic complications: no   No notable events documented.  Last Vitals:  Vitals:   07/11/21 0600 07/11/21 0858  BP: 124/81 (!) 135/93  Pulse: 69 80  Resp: 16 16  Temp: 36.8 C 36.7 C  SpO2: 97% 95%    Last Pain:  Vitals:   07/11/21 0745  TempSrc:   PainSc: 3                  Enna Warwick S

## 2021-08-15 ENCOUNTER — Other Ambulatory Visit: Payer: Self-pay

## 2021-08-15 NOTE — Progress Notes (Signed)
The proposed treatment discussed in conference is for discussion purpose only and is not a binding recommendation.  The patients have not been physically examined, or presented with their treatment options.  Therefore, final treatment plans cannot be decided.  

## 2021-12-20 ENCOUNTER — Other Ambulatory Visit: Payer: Self-pay | Admitting: Surgery

## 2021-12-20 DIAGNOSIS — K862 Cyst of pancreas: Secondary | ICD-10-CM

## 2022-01-15 ENCOUNTER — Ambulatory Visit
Admission: RE | Admit: 2022-01-15 | Discharge: 2022-01-15 | Disposition: A | Discharge status: Home / Self Care | Payer: Medicare Other | Source: Ambulatory Visit | Attending: Surgery | Admitting: Surgery

## 2022-01-15 DIAGNOSIS — K862 Cyst of pancreas: Secondary | ICD-10-CM

## 2022-01-15 MED ORDER — GADOPICLENOL 0.5 MMOL/ML IV SOLN
7.5000 mL | Freq: Once | INTRAVENOUS | Status: AC | PRN
  Administered 2022-01-15: 7.5 mL via INTRAVENOUS

## 2022-02-18 HISTORY — PX: COLONOSCOPY: SHX174

## 2022-05-28 ENCOUNTER — Other Ambulatory Visit: Payer: Self-pay | Admitting: Gastroenterology

## 2022-05-31 ENCOUNTER — Encounter (HOSPITAL_COMMUNITY): Payer: Self-pay | Admitting: Gastroenterology

## 2022-06-07 ENCOUNTER — Ambulatory Visit (HOSPITAL_COMMUNITY)
Admission: RE | Admit: 2022-06-07 | Discharge: 2022-06-07 | Disposition: A | Discharge status: Home / Self Care | Payer: Medicare Other | Attending: Gastroenterology | Admitting: Gastroenterology

## 2022-06-07 ENCOUNTER — Encounter (HOSPITAL_COMMUNITY)
Admission: RE | Disposition: A | Discharge status: Home / Self Care | Payer: Self-pay | Source: Home / Self Care | Attending: Gastroenterology

## 2022-06-07 ENCOUNTER — Encounter (HOSPITAL_COMMUNITY): Payer: Self-pay | Admitting: Gastroenterology

## 2022-06-07 ENCOUNTER — Ambulatory Visit (HOSPITAL_BASED_OUTPATIENT_CLINIC_OR_DEPARTMENT_OTHER): Payer: Medicare Other | Admitting: Certified Registered Nurse Anesthetist

## 2022-06-07 ENCOUNTER — Ambulatory Visit (HOSPITAL_COMMUNITY): Payer: Medicare Other | Admitting: Certified Registered Nurse Anesthetist

## 2022-06-07 ENCOUNTER — Other Ambulatory Visit: Payer: Self-pay

## 2022-06-07 DIAGNOSIS — M1990 Unspecified osteoarthritis, unspecified site: Secondary | ICD-10-CM

## 2022-06-07 DIAGNOSIS — K862 Cyst of pancreas: Secondary | ICD-10-CM

## 2022-06-07 DIAGNOSIS — I499 Cardiac arrhythmia, unspecified: Secondary | ICD-10-CM

## 2022-06-07 DIAGNOSIS — F419 Anxiety disorder, unspecified: Secondary | ICD-10-CM | POA: Diagnosis not present

## 2022-06-07 HISTORY — PX: UPPER ESOPHAGEAL ENDOSCOPIC ULTRASOUND (EUS): SHX6562

## 2022-06-07 HISTORY — PX: FINE NEEDLE ASPIRATION: SHX5430

## 2022-06-07 HISTORY — PX: ESOPHAGOGASTRODUODENOSCOPY (EGD) WITH PROPOFOL: SHX5813

## 2022-06-07 SURGERY — UPPER ESOPHAGEAL ENDOSCOPIC ULTRASOUND (EUS)
Anesthesia: Monitor Anesthesia Care

## 2022-06-07 MED ORDER — LIDOCAINE 2% (20 MG/ML) 5 ML SYRINGE
INTRAMUSCULAR | Status: DC | PRN
  Administered 2022-06-07: 100 mg via INTRAVENOUS

## 2022-06-07 MED ORDER — PROPOFOL 10 MG/ML IV BOLUS
INTRAVENOUS | Status: DC | PRN
  Administered 2022-06-07: 30 mg via INTRAVENOUS
  Administered 2022-06-07: 20 mg via INTRAVENOUS
  Administered 2022-06-07: 30 mg via INTRAVENOUS
  Administered 2022-06-07: 20 mg via INTRAVENOUS

## 2022-06-07 MED ORDER — PROPOFOL 500 MG/50ML IV EMUL
INTRAVENOUS | Status: DC | PRN
  Administered 2022-06-07: 125 ug/kg/min via INTRAVENOUS

## 2022-06-07 MED ORDER — CIPROFLOXACIN IN D5W 400 MG/200ML IV SOLN
INTRAVENOUS | Status: AC
  Filled 2022-06-07: qty 200

## 2022-06-07 MED ORDER — SODIUM CHLORIDE 0.9 % IV SOLN: INTRAVENOUS | Status: DC

## 2022-06-07 MED ORDER — LACTATED RINGERS IV SOLN: INTRAVENOUS | Status: DC

## 2022-06-07 MED ORDER — PROPOFOL 10 MG/ML IV BOLUS
INTRAVENOUS | Status: AC
  Filled 2022-06-07: qty 20

## 2022-06-07 MED ORDER — CIPROFLOXACIN IN D5W 400 MG/200ML IV SOLN
INTRAVENOUS | Status: DC | PRN
  Administered 2022-06-07: 400 mg via INTRAVENOUS

## 2022-06-07 MED ORDER — PROPOFOL 1000 MG/100ML IV EMUL
INTRAVENOUS | Status: AC
  Filled 2022-06-07: qty 100

## 2022-06-07 NOTE — Anesthesia Postprocedure Evaluation (Signed)
Anesthesia Post Note  Patient: Angel Lawson  Procedure(s) Performed: UPPER ESOPHAGEAL ENDOSCOPIC ULTRASOUND (EUS) FINE NEEDLE ASPIRATION (FNA) LINEAR     Patient location during evaluation: Endoscopy Anesthesia Type: MAC Level of consciousness: awake and alert Pain management: pain level controlled Vital Signs Assessment: post-procedure vital signs reviewed and stable Respiratory status: spontaneous breathing and respiratory function stable Cardiovascular status: stable Postop Assessment: no apparent nausea or vomiting Anesthetic complications: no  No notable events documented.  Last Vitals:  Vitals:   06/07/22 1045 06/07/22 1056  BP: 106/75 104/73  Pulse: (!) 57 (!) 57  Resp: 12 15  Temp: 36.5 C   SpO2: 97% 98%    Last Pain:  Vitals:   06/07/22 1056  TempSrc:   PainSc: 0-No pain                 Wally Shevchenko DANIEL

## 2022-06-07 NOTE — Anesthesia Preprocedure Evaluation (Addendum)
Anesthesia Evaluation  Patient identified by MRN, date of birth, ID band Patient awake    Reviewed: Allergy & Precautions, NPO status , Patient's Chart, lab work & pertinent test results  Airway Mallampati: II  TM Distance: >3 FB Neck ROM: Full    Dental no notable dental hx. (+) Dental Advisory Given   Pulmonary neg pulmonary ROS   Pulmonary exam normal        Cardiovascular Normal cardiovascular exam+ dysrhythmias      Neuro/Psych  PSYCHIATRIC DISORDERS Anxiety     negative neurological ROS     GI/Hepatic Neg liver ROS,,,  Endo/Other  negative endocrine ROS    Renal/GU negative Renal ROS     Musculoskeletal  (+) Arthritis ,    Abdominal   Peds  Hematology negative hematology ROS (+)   Anesthesia Other Findings   Reproductive/Obstetrics                             Lab Results  Component Value Date   WBC 8.3 07/11/2021   HGB 14.5 07/11/2021   HCT 44.2 07/11/2021   MCV 91.3 07/11/2021   PLT 208 07/11/2021   Lab Results  Component Value Date   CREATININE 0.91 07/11/2021   BUN 9 07/11/2021   NA 142 07/11/2021   K 4.2 07/11/2021   CL 111 07/11/2021   CO2 25 07/11/2021    Anesthesia Physical Anesthesia Plan  ASA: 2  Anesthesia Plan: MAC   Post-op Pain Management: Minimal or no pain anticipated   Induction: Intravenous  PONV Risk Score and Plan: 1 and Ondansetron, Treatment may vary due to age or medical condition and Propofol infusion  Airway Management Planned: Natural Airway  Additional Equipment:   Intra-op Plan:   Post-operative Plan:   Informed Consent: I have reviewed the patients History and Physical, chart, labs and discussed the procedure including the risks, benefits and alternatives for the proposed anesthesia with the patient or authorized representative who has indicated his/her understanding and acceptance.     Dental advisory given  Plan Discussed  with: Anesthesiologist and CRNA  Anesthesia Plan Comments:         Anesthesia Quick Evaluation

## 2022-06-07 NOTE — Op Note (Signed)
[facility] Patient Name: Angel Lawson Procedure Date: 06/07/2022 MRN: [id number] Attending MD: Jeani Hawking , MD, [id number] Date of Birth: 1955/08/20 CSN: [id number] Age: 67 Admit Type: Outpatient Procedure:                Upper EUS Indications:              Pancreatic cyst on CT scan Providers:                Jeani Hawking, MD, Pollie Friar RN, RN, Kandice Robinsons, Technician Referring MD:              Medicines:                Propofol per Anesthesia Complications:            No immediate complications. Estimated Blood Loss:     Estimated blood loss: none. Procedure:                Pre-Anesthesia Assessment:                           - Prior to the procedure, a History and Physical                            was performed, and patient medications and                            allergies were reviewed. The patient's tolerance of                            previous anesthesia was also reviewed. The risks                            and benefits of the procedure and the sedation                            options and risks were discussed with the patient.                            All questions were answered, and informed consent                            was obtained. Prior Anticoagulants: The patient has                            taken no anticoagulant or antiplatelet agents. ASA                            Grade Assessment: II - A patient with mild systemic                            disease. After reviewing the risks and benefits,  the patient was deemed in satisfactory condition to                            undergo the procedure.                           - Sedation was administered by an anesthesia                            professional. Deep sedation was attained.                           After obtaining informed consent, the endoscope was                            passed under direct vision.  Throughout the                            procedure, the patient's blood pressure, pulse, and                            oxygen saturations were monitored continuously. The                            [id number] ([id number]) Olympus linear ultrasound scope                            was introduced through the mouth, and advanced to                            the second part of duodenum. The upper EUS was                            accomplished without difficulty. The patient                            tolerated the procedure well. Scope In: Scope Out: Findings:      ENDOSONOGRAPHIC FINDING: :      An anechoic lesion suggestive of a cyst was identified in the pancreatic       body. It is not in obvious communication with the pancreatic duct. The       lesion measured 39 mm by 15 mm in maximal cross-sectional diameter.       There were 2 compartments thinly septated. The outer wall of the lesion       was thin. There was no associated mass. There was no internal debris       within the fluid-filled cavity. Diagnostic needle aspiration for fluid       was performed. Color Doppler imaging was utilized prior to needle       puncture to confirm a lack of significant vascular structures within the       needle path. Two passes were made with the 19 gauge needle using a       transgastric approach. No stylet was used. The amount of fluid collected       was 2 mL. The fluid  was white and viscous. Sample(s) were sent for       amylase concentration, glucose, cytology and CEA.      The pancreatic body cyst was readily identified. It measured       approximately 3.9 cm x 1.5 cm at its greatest dimensions. IV cipro was       administered. The cyst was elongated and there was an incomplete       internal septation. A 22 gauge FNA needle was inserted in to the cyst       and aspiration of clear fluid was obtained, however, the fluid was       thick. The 19 gauge FNA needle was used and some more fluid was        obtained, but collapsing the cyst was not possible. Only 2 ml of clear       white fluid was obtained. There was no evidence of any nodules or       associated masses with the cyst. Communication with the main PD was not       identified and the PD was normal in caliber from head to tail. The       pancreatic parenchyma exhibited hyperechoic stranding and there was some       suggestion of lobularity in the head of the pancreas. The CBD was normal       in caliber at 5 mm and there was no gallbladder. No other significant       pathology was identified. Impression:               - A cystic lesion was seen in the pancreatic body.                            Fine needle aspiration for fluid performed. Moderate Sedation:      Not Applicable - Patient had care per Anesthesia. Recommendation:           - Patient has a contact number available for                            emergencies. The signs and symptoms of potential                            delayed complications were discussed with the                            patient. Return to normal activities tomorrow.                            Written discharge instructions were provided to the                            patient.                           - Resume previous diet.                           - Await fluid analysis.                           -  Cipro 500 mg BID x 3 days. Procedure Code(s):        --- Professional ---                           [phone], Esophagogastroduodenoscopy, flexible,                            transoral; with transendoscopic ultrasound-guided                            intramural or transmural fine needle                            aspiration/biopsy(s), (includes endoscopic                            ultrasound examination limited to the esophagus,                            stomach or duodenum, and adjacent structures) Diagnosis Code(s):        --- Professional ---                           K86.2, Cyst of  pancreas CPT copyright 2022 American Medical Association. All rights reserved. The codes documented in this report are preliminary and upon coder review may  be revised to meet current compliance requirements. Jeani Hawking, MD Jeani Hawking, MD 06/07/2022 10:52:30 AM This report has been signed electronically. Number of Addenda: 0

## 2022-06-07 NOTE — Transfer of Care (Signed)
Immediate Anesthesia Transfer of Care Note  Patient: IKENNA OHMS  Procedure(s) Performed: UPPER ESOPHAGEAL ENDOSCOPIC ULTRASOUND (EUS)  Patient Location: Endoscopy Unit  Anesthesia Type:MAC  Level of Consciousness: drowsy and patient cooperative  Airway & Oxygen Therapy: Patient Spontanous Breathing and Patient connected to face mask oxygen  Post-op Assessment: Report given to RN and Post -op Vital signs reviewed and stable  Post vital signs: Reviewed and stable  Last Vitals:  Vitals Value Taken Time  BP    Temp    Pulse    Resp    SpO2      Last Pain:  Vitals:   06/07/22 0932  TempSrc: Temporal  PainSc: 0-No pain         Complications: No notable events documented.

## 2022-06-07 NOTE — H&P (Signed)
Angel Lawson HPI: Last March he presented to the ER with complaints of dizziness and chest/epigastric pain.  Work up with a CT angio showed an incidental finding of a 2.9 cm exophytic low-density lesion in the body of the pancreas.  Blood work at that time did not show any elevation in his liver enzymes.  An MRCP was recommended.  Over the interval time period his symptoms completely resolved, but he was admitted on 07/08/2021 with severe upper abdominal pain.  This time his blood work showed a marked elvation inhis liver enzymes and lipase:  AST 226, ALT 217, TB 4.3 and lipase 10,000.  An MRCP was obtained and it showed the 3.1 cm cyst in the body of the pancreas and there was no evidence of cholelithiasis or choledocholithiasis.  A lap chole was performed and he was found to have chronic cholecystitis and cholelithiasis.  A follow up MRCP was performed on 01/15/2022 and there was a mild enlargement of the cyst to 3.7 cm.  No high-risk features were noted, i.e., mural nodule or ductal dilation.  Currently he feels well.  Additionally his last colonoscopy was 10/2016 and several adenomas were found and removed.   Past Medical History:  Diagnosis Date   Anxiety    High cholesterol     Past Surgical History:  Procedure Laterality Date   LAPAROSCOPIC CHOLECYSTECTOMY SINGLE SITE WITH INTRAOPERATIVE CHOLANGIOGRAM N/A 07/10/2021   Procedure: LAPAROSCOPIC CHOLECYSTECTOMY SINGLE SITE WITH INTRAOPERATIVE CHOLANGIOGRAM AND PRIMARY UMBILICAL HERNIA REPAIR;  Surgeon: Karie Soda, MD;  Location: [facility];  Service: General;  Laterality: N/A;    History reviewed. No pertinent family history.  Social History:  reports that he has never smoked. He has never used smokeless tobacco. He reports that he does not drink alcohol and does not use drugs.  Allergies: No Known Allergies  Medications: Scheduled: Continuous:  sodium chloride     lactated ringers      No results found for this or any previous visit  (from the past 24 hour(s)).   No results found.  ROS:  As stated above in the HPI otherwise negative.  Height  (1.753 m), weight 73.5 kg.    PE: Gen: NAD, Alert and Oriented HEENT:  [place]/AT, EOMI Neck: Supple, no LAD Lungs: CTA Bilaterally CV: RRR without M/G/R ABD: Soft, NTND, +BS Ext: No C/C/E  Assessment/Plan: 1) Pancreatic cyst - EUS with FNA.  Angel Lawson D 06/07/2022, 9:25 AM

## 2022-06-10 ENCOUNTER — Encounter (HOSPITAL_COMMUNITY): Payer: Self-pay | Admitting: Gastroenterology

## 2022-11-05 ENCOUNTER — Ambulatory Visit: Payer: Self-pay | Admitting: Surgery

## 2022-11-05 DIAGNOSIS — K862 Cyst of pancreas: Secondary | ICD-10-CM

## 2022-11-05 NOTE — H&P (Signed)
History of Present Illness: Angel Lawson is a 67 y.o. 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 He has had changes in his health or medical history since his last visit.  He denies any abdominal pain.  His last cross-sectional imaging study was in November 2023.  His EUS in April showed a 3.9 cm cyst, with fluid study results as follows:   CEA 199,640 Amylase 5.1 Glucose <4.3     His only prior abdominal surgery is a laparoscopic cholecystectomy in 2023, for presumed biliary pancreatitis.   Review of Systems: A complete review of systems was obtained from the patient.  I have reviewed this information and discussed as appropriate with the patient.  See HPI as well for other ROS.     Medical History: Past Medical History Past Medical History: Diagnosis Date  Anxiety    Arthritis    Hyperlipidemia        Problem List Patient Active Problem List Diagnosis  Pancreatic cyst ([facility])  S/P laparoscopic cholecystectomy      Past Surgical History Past Surgical History: Procedure Laterality Date  CHOLECYSTECTOMY          Allergies No Known Allergies    Medications Ordered Prior to Encounter Current Outpatient Medications on File Prior to Visit Medication Sig Dispense Refill  buPROPion (WELLBUTRIN XL) 300 MG XL tablet Take 300 mg by mouth every morning      rosuvastatin (CRESTOR) 20 MG tablet Take 20 mg by mouth at bedtime      tadalafiL (CIALIS) 5 MG tablet Take 5 mg by mouth every morning      traMADoL (ULTRAM) 50 mg tablet Take by mouth        No current facility-administered medications on file prior to visit.      Family History Family History Problem Relation Age of Onset  Deep vein thrombosis (DVT or abnormal blood clot formation) Mother    Hyperlipidemia (Elevated cholesterol) Father        Tobacco Use History Social History    Tobacco Use Smoking Status Former  Current  packs/day: 0.00  Types: Cigarettes  Quit date: 1985  Years since quitting: 39.7 Smokeless Tobacco Never      Social History Social History    Socioeconomic History  Marital status: Married Tobacco Use  Smoking status: Former     Current packs/day: 0.00     Types: Cigarettes     Quit date: 1985     Years since quitting: 39.7  Smokeless tobacco: Never Substance and Sexual Activity  Alcohol use: Never  Drug use: Never    Social Determinants of Health    Financial Resource Strain: Low Risk  (10/03/2022)   Received from [facility]   Overall Financial Resource Strain (CARDIA)    Difficulty of Paying Living Expenses: Not hard at all Food Insecurity: No Food Insecurity (10/03/2022)   Received from [facility]   Hunger Vital Sign    Worried About Running Out of Food in the Last Year: Never true    Ran Out of Food in the Last Year: Never true Transportation Needs: No Transportation Needs (10/03/2022)   Received from [facility] - Transportation    Lack of Transportation (Medical): No    Lack of Transportation (Non-Medical): No Physical Activity: Insufficiently Active (10/03/2022)   Received from [facility]   Exercise Vital Sign    Days of Exercise per Week: 3 days    Minutes of  Exercise per Session: 30 min Stress: No Stress Concern Present (10/03/2022)   Received from [facility] - Occupational Stress Questionnaire    Feeling of Stress : Not at all Social Connections: Moderately Integrated (10/03/2022)   Received from [facility]   Social Network    How would you rate your social network (family, work, friends)?: Adequate participation with social networks Housing Stability: Low Risk  (03/31/2022)   Received from [facility]   Housing Stability Vital Sign    Unable to Pay for Housing in the Last Year: No    Number of Places Lived in the Last Year: 1    In the last 12 months, was there a  time when you did not have a steady place to sleep or slept in a shelter (including now)?: No      Objective:     Vitals:   11/05/22 1135 BP: 122/78 Pulse: (!) 116 Temp: 36.6 C (97.8 F) SpO2: 96% Weight: 75.8 kg (167 lb 3.2 oz) PainSc: 0-No pain   Body mass index is 24.69 kg/m.   Physical Exam Vitals reviewed.  Constitutional:      General: He is not in acute distress.    Appearance: Normal appearance.  HENT:     Head: Normocephalic and atraumatic.  Eyes:     General: No scleral icterus.    Conjunctiva/sclera: Conjunctivae normal.  Pulmonary:     Effort: Pulmonary effort is normal. No respiratory distress.  Abdominal:     General: There is no distension.     Palpations: Abdomen is soft.     Tenderness: There is no abdominal tenderness.  Skin:    General: Skin is warm and dry.  Neurological:     General: No focal deficit present.     Mental Status: He is alert and oriented to person, place, and time.      Assessment and Plan:   Assessment Diagnoses and all orders for this visit:   Mucinous neoplasm of pancreas -     CT dual pancreas protocol incl CT abd pel w contrast       This is a 67 year old male with an MCN in the body of the pancreas, confirmed with EUS and FNA. Resection was recommended via laparoscopic distal pancreatectomy with possible splenectomy, although we will attempt to preserve his spleen. The details of this procedure were discussed, including the benefits and the risks of bleeding, infection, damage to surrounding structures, 20% risk of pancreatic fistula, risk of diabetes, and risk of post-splenectomy sepsis.  I also discussed the possibility of conversion to laparotomy, especially if he has extensive adhesions from his prior pancreatitis.  He will need up-to-date imaging prior to his surgery as it has been nearly a year since his last cross-sectional imaging.  Will obtain a CT scan with pancreas protocol IV contrast prior to surgery.  He will  be contacted to schedule his scan and his surgery date.  All questions were answered and the patient agrees to proceed with surgery.  Sophronia Simas, MD [facility] Surgery General, Hepatobiliary and Pancreatic Surgery 11/05/22 12:09 PM

## 2022-11-15 ENCOUNTER — Other Ambulatory Visit: Payer: Self-pay | Admitting: Surgery

## 2022-11-15 DIAGNOSIS — D490 Neoplasm of unspecified behavior of digestive system: Secondary | ICD-10-CM

## 2022-11-27 NOTE — Pre-Procedure Instructions (Signed)
Surgical Instructions   Your procedure is scheduled on December 06, 2022. Report to [facility] Main Entrance "A" at 7:30 A.M., then check in with the Admitting office. Any questions or running late day of surgery: call [phone]  Questions prior to your surgery date: call [phone], Monday-Friday, 8am-4pm. If you experience any cold or flu symptoms such as cough, fever, chills, shortness of breath, etc. between now and your scheduled surgery, please notify us at the above number.     Remember:  Do not eat after midnight the night before your surgery   You may drink clear liquids until 6:30 AM the morning of your surgery.   Clear liquids allowed are: Water, Non-Citrus Juices (without pulp), Carbonated Beverages, Clear Tea, Black Coffee Only (NO MILK, CREAM OR POWDERED CREAMER of any kind), and Gatorade.  Patient Instructions  The night before surgery:  No food after midnight. ONLY clear liquids after midnight Drink TWO (2) Pre-Surgery Clear Ensures before you go to sleep the night before surgery.  The day of surgery (if you do NOT have diabetes):  Drink ONE (1) Pre-Surgery Clear Ensure by 6:30 AM the morning of surgery. Drink in one sitting. Do not sip.  This drink was given to you during your hospital  pre-op appointment visit.  Nothing else to drink after completing the  Pre-Surgery Clear Ensure.         If you have questions, please contact your surgeon's office.     Take these medicines the morning of surgery with A SIP OF WATER: buPROPion (WELLBUTRIN XL)    May take these medicines IF NEEDED: ALPRAZolam (XANAX)  fluticasone (FLONASE) nasal spray    One week prior to surgery, STOP taking any Aspirin (unless otherwise instructed by your surgeon) Aleve, Naproxen, Ibuprofen, Motrin, Advil, Goody's, BC's, all herbal medications, fish oil, and non-prescription vitamins.                     Do NOT Smoke (Tobacco/Vaping) for 24 hours prior to your procedure.  If you  use a CPAP at night, you may bring your mask/headgear for your overnight stay.   You will be asked to remove any contacts, glasses, piercing's, hearing aid's, dentures/partials prior to surgery. Please bring cases for these items if needed.    Patients discharged the day of surgery will not be allowed to drive home, and someone needs to stay with them for 24 hours.  SURGICAL WAITING ROOM VISITATION Patients may have no more than 2 support people in the waiting area - these visitors may rotate.   Pre-op nurse will coordinate an appropriate time for 1 ADULT support person, who may not rotate, to accompany patient in pre-op.  Children under the age of 82 must have an adult with them who is not the patient and must remain in the main waiting area with an adult.  If the patient needs to stay at the hospital during part of their recovery, the visitor guidelines for inpatient rooms apply.  Please refer to the [facility] website for the visitor guidelines for any additional information.   If you received a COVID test during your pre-op visit  it is requested that you wear a mask when out in public, stay away from anyone that may not be feeling well and notify your surgeon if you develop symptoms. If you have been in contact with anyone that has tested positive in the last 10 days please notify you surgeon.      Pre-operative  CHG Bathing Instructions   You can play a key role in reducing the risk of infection after surgery. Your skin needs to be as free of germs as possible. You can reduce the number of germs on your skin by washing with CHG (chlorhexidine gluconate) soap before surgery. CHG is an antiseptic soap that kills germs and continues to kill germs even after washing.   DO NOT use if you have an allergy to chlorhexidine/CHG or antibacterial soaps. If your skin becomes reddened or irritated, stop using the CHG and notify one of our RNs at [phone].              TAKE A SHOWER THE NIGHT  BEFORE SURGERY AND THE DAY OF SURGERY    Please keep in mind the following:  DO NOT shave, including legs and underarms, 48 hours prior to surgery.   You may shave your face before/day of surgery.  Place clean sheets on your bed the night before surgery Use a clean washcloth (not used since being washed) for each shower. DO NOT sleep with pet's night before surgery.  CHG Shower Instructions:  Wash your face and private area with normal soap. If you choose to wash your hair, wash first with your normal shampoo.  After you use shampoo/soap, rinse your hair and body thoroughly to remove shampoo/soap residue.  Turn the water OFF and apply half the bottle of CHG soap to a CLEAN washcloth.  Apply CHG soap ONLY FROM YOUR NECK DOWN TO YOUR TOES (washing for 3-5 minutes)  DO NOT use CHG soap on face, private areas, open wounds, or sores.  Pay special attention to the area where your surgery is being performed.  If you are having back surgery, having someone wash your back for you may be helpful. Wait 2 minutes after CHG soap is applied, then you may rinse off the CHG soap.  Pat dry with a clean towel  Put on clean pajamas    Additional instructions for the day of surgery: DO NOT APPLY any lotions, deodorants, cologne, or perfumes.   Do not wear jewelry or makeup Do not wear nail polish, gel polish, artificial nails, or any other type of covering on natural nails (fingers and toes) Do not bring valuables to the hospital. [facility] is not responsible for valuables/personal belongings. Put on clean/comfortable clothes.  Please brush your teeth.  Ask your nurse before applying any prescription medications to the skin.

## 2022-11-28 ENCOUNTER — Encounter (HOSPITAL_COMMUNITY): Payer: Self-pay

## 2022-11-28 ENCOUNTER — Encounter (HOSPITAL_COMMUNITY)
Admission: RE | Admit: 2022-11-28 | Discharge: 2022-11-28 | Disposition: A | Discharge status: Home / Self Care | Payer: Medicare Other | Source: Ambulatory Visit | Attending: Surgery | Admitting: Surgery

## 2022-11-28 ENCOUNTER — Other Ambulatory Visit: Payer: Self-pay

## 2022-11-28 VITALS — BP 117/86 | HR 76 | Temp 97.5°F | Resp 17 | Ht 69.0 in | Wt 169.2 lb

## 2022-11-28 DIAGNOSIS — Z01818 Encounter for other preprocedural examination: Secondary | ICD-10-CM

## 2022-11-28 DIAGNOSIS — I2510 Atherosclerotic heart disease of native coronary artery without angina pectoris: Secondary | ICD-10-CM

## 2022-11-28 DIAGNOSIS — K862 Cyst of pancreas: Secondary | ICD-10-CM | POA: Diagnosis not present

## 2022-11-28 HISTORY — DX: Gastro-esophageal reflux disease without esophagitis: K21.9

## 2022-11-28 HISTORY — DX: Unspecified osteoarthritis, unspecified site: M19.90

## 2022-11-28 LAB — BASIC METABOLIC PANEL
Anion gap: 8 (ref 5–15)
BUN: 17 mg/dL (ref 8–23)
CO2: 25 mmol/L (ref 22–32)
Calcium: 8.8 mg/dL — ABNORMAL LOW (ref 8.9–10.3)
Chloride: 107 mmol/L (ref 98–111)
Creatinine, Ser: 1.14 mg/dL (ref 0.61–1.24)
GFR, Estimated: >60 mL/min (ref >60)
Glucose, Bld: 95 mg/dL (ref 70–99)
Potassium: 4.7 mmol/L (ref 3.5–5.1)
Sodium: 140 mmol/L (ref 135–145)

## 2022-11-28 LAB — CBC
HCT: 44.6 % (ref 39.0–52.0)
Hemoglobin: 15.1 g/dL (ref 13.0–17.0)
MCH: 30.8 pg (ref 26.0–34.0)
MCHC: 33.9 g/dL (ref 30.0–36.0)
MCV: 91 fL (ref 80.0–100.0)
Platelets: 195 10*3/uL (ref 150–400)
RBC: 4.9 MIL/uL (ref 4.22–5.81)
RDW: 13.2 % (ref 11.5–15.5)
WBC: 4.4 10*3/uL (ref 4.0–10.5)
nRBC: 0 % (ref 0.0–0.2)

## 2022-11-28 NOTE — Progress Notes (Signed)
PCP - Dr. Antony Haste Cardiologist - Denies  PPM/ICD - Denies Device Orders - n/a Rep Notified - n/a  Chest x-ray - Denies EKG - 11/28/2022 Stress Test - Denies ECHO - Denies Cardiac Cath - Denies CT Coronary - 04/06/2021 (CE) - "Moderate calcified coronary artery plaque"   Sleep Study - Pt tested negative many years ago CPAP - n/a  No DM  Last dose of GLP1 agonist- n/a GLP1 instructions: n/a  Blood Thinner Instructions: n/a Aspirin Instructions: n/a  ERAS Protcol - Clear liquids until 0630 morning of surgery PRE-SURGERY Ensure or G2- Pt given 3 Ensures with instructions  COVID TEST- n/a   Anesthesia review: Yes. Abnormal EKG   Patient denies shortness of breath, fever, cough and chest pain at PAT appointment. Pt denies any respiratory illness/infection in the last two months.   All instructions explained to the patient, with a verbal understanding of the material. Patient agrees to go over the instructions while at home for a better understanding. Patient also instructed to self quarantine after being tested for COVID-19. The opportunity to ask questions was provided.

## 2022-11-29 ENCOUNTER — Ambulatory Visit
Admission: RE | Admit: 2022-11-29 | Discharge: 2022-11-29 | Disposition: A | Discharge status: Home / Self Care | Payer: Medicare Other | Source: Ambulatory Visit | Attending: Surgery | Admitting: Surgery

## 2022-11-29 DIAGNOSIS — D490 Neoplasm of unspecified behavior of digestive system: Secondary | ICD-10-CM

## 2022-11-29 MED ORDER — IOPAMIDOL (ISOVUE-300) INJECTION 61%
500.0000 mL | Freq: Once | INTRAVENOUS | Status: AC | PRN
  Administered 2022-11-29: 100 mL via INTRAVENOUS

## 2022-12-05 NOTE — Progress Notes (Signed)
Spoke with the pt to arrive tom at 0700. NPO post midnight and to stop clear liquids at 0600.

## 2022-12-06 ENCOUNTER — Other Ambulatory Visit: Payer: Self-pay

## 2022-12-06 ENCOUNTER — Encounter (HOSPITAL_COMMUNITY)
Admission: RE | Disposition: A | Discharge status: Home / Self Care | Payer: Self-pay | Source: Home / Self Care | Attending: Surgery

## 2022-12-06 ENCOUNTER — Inpatient Hospital Stay (HOSPITAL_COMMUNITY)
Admission: RE | Admit: 2022-12-06 | Discharge: 2022-12-10 | DRG: 983 | Disposition: A | Discharge status: Home / Self Care | Payer: Medicare Other | Attending: Surgery | Admitting: Surgery

## 2022-12-06 ENCOUNTER — Inpatient Hospital Stay (HOSPITAL_COMMUNITY): Payer: Medicare Other | Admitting: Physician Assistant

## 2022-12-06 ENCOUNTER — Encounter (HOSPITAL_COMMUNITY): Payer: Self-pay | Admitting: Surgery

## 2022-12-06 ENCOUNTER — Inpatient Hospital Stay (HOSPITAL_COMMUNITY): Payer: Medicare Other | Admitting: Anesthesiology

## 2022-12-06 DIAGNOSIS — E7800 Pure hypercholesterolemia, unspecified: Secondary | ICD-10-CM | POA: Diagnosis present

## 2022-12-06 DIAGNOSIS — K862 Cyst of pancreas: Secondary | ICD-10-CM

## 2022-12-06 DIAGNOSIS — Z79899 Other long term (current) drug therapy: Secondary | ICD-10-CM

## 2022-12-06 DIAGNOSIS — F419 Anxiety disorder, unspecified: Secondary | ICD-10-CM | POA: Diagnosis present

## 2022-12-06 DIAGNOSIS — D490 Neoplasm of unspecified behavior of digestive system: Secondary | ICD-10-CM | POA: Diagnosis present

## 2022-12-06 DIAGNOSIS — M1990 Unspecified osteoarthritis, unspecified site: Secondary | ICD-10-CM | POA: Diagnosis present

## 2022-12-06 DIAGNOSIS — D017 Carcinoma in situ of other specified digestive organs: Secondary | ICD-10-CM | POA: Diagnosis present

## 2022-12-06 HISTORY — PX: PANCREATECTOMY: SHX5261

## 2022-12-06 LAB — PREPARE RBC (CROSSMATCH)

## 2022-12-06 LAB — GLUCOSE, CAPILLARY
Glucose-Capillary: 156 mg/dL — ABNORMAL HIGH (ref 70–99)
Glucose-Capillary: 157 mg/dL — ABNORMAL HIGH (ref 70–99)
Glucose-Capillary: 158 mg/dL — ABNORMAL HIGH (ref 70–99)
Glucose-Capillary: 174 mg/dL — ABNORMAL HIGH (ref 70–99)

## 2022-12-06 LAB — ABO/RH: ABO/RH(D): A POS

## 2022-12-06 SURGERY — PANCREATECTOMY, LAPAROSCOPIC
Anesthesia: General

## 2022-12-06 MED ORDER — DIPHENHYDRAMINE HCL 12.5 MG/5ML PO ELIX: 12.5000 mg | ORAL_SOLUTION | Freq: Four times a day (QID) | ORAL | Status: DC | PRN

## 2022-12-06 MED ORDER — DOCUSATE SODIUM 100 MG PO CAPS
100.0000 mg | ORAL_CAPSULE | Freq: Two times a day (BID) | ORAL | Status: DC
  Administered 2022-12-06 – 2022-12-09 (×7): 100 mg via ORAL
  Filled 2022-12-06 (×8): qty 1

## 2022-12-06 MED ORDER — METRONIDAZOLE 500 MG/100ML IV SOLN
500.0000 mg | INTRAVENOUS | Status: AC
  Administered 2022-12-06: 500 mg via INTRAVENOUS
  Filled 2022-12-06: qty 100

## 2022-12-06 MED ORDER — LACTATED RINGERS IV SOLN: INTRAVENOUS | Status: DC

## 2022-12-06 MED ORDER — MELATONIN 3 MG PO TABS: 3.0000 mg | ORAL_TABLET | Freq: Every evening | ORAL | Status: DC | PRN

## 2022-12-06 MED ORDER — ALBUMIN HUMAN 5 % IV SOLN
INTRAVENOUS | Status: DC | PRN
Start: 2022-12-06 — End: 2022-12-06

## 2022-12-06 MED ORDER — ALUM & MAG HYDROXIDE-SIMETH 200-200-20 MG/5ML PO SUSP
30.0000 mL | ORAL | Status: DC | PRN
  Administered 2022-12-06 – 2022-12-08 (×2): 30 mL via ORAL
  Filled 2022-12-06 (×2): qty 30

## 2022-12-06 MED ORDER — ENOXAPARIN SODIUM 40 MG/0.4ML IJ SOSY
40.0000 mg | PREFILLED_SYRINGE | INTRAMUSCULAR | Status: DC
  Administered 2022-12-07 – 2022-12-10 (×4): 40 mg via SUBCUTANEOUS
  Filled 2022-12-06 (×4): qty 0.4

## 2022-12-06 MED ORDER — CEFAZOLIN SODIUM-DEXTROSE 2-4 GM/100ML-% IV SOLN
2.0000 g | INTRAVENOUS | Status: AC
  Administered 2022-12-06: 2 g via INTRAVENOUS
  Filled 2022-12-06: qty 100

## 2022-12-06 MED ORDER — PHENYLEPHRINE HCL-NACL 20-0.9 MG/250ML-% IV SOLN
INTRAVENOUS | Status: DC | PRN
  Administered 2022-12-06: 40 ug/min via INTRAVENOUS

## 2022-12-06 MED ORDER — ACETAMINOPHEN 10 MG/ML IV SOLN
INTRAVENOUS | Status: AC
  Filled 2022-12-06: qty 100

## 2022-12-06 MED ORDER — BUPIVACAINE-EPINEPHRINE (PF) 0.25% -1:200000 IJ SOLN
INTRAMUSCULAR | Status: AC
  Filled 2022-12-06: qty 30

## 2022-12-06 MED ORDER — DEXAMETHASONE SODIUM PHOSPHATE 10 MG/ML IJ SOLN
INTRAMUSCULAR | Status: DC | PRN
  Administered 2022-12-06: 10 mg via INTRAVENOUS

## 2022-12-06 MED ORDER — HYDROMORPHONE HCL 1 MG/ML IJ SOLN
INTRAMUSCULAR | Status: AC
  Filled 2022-12-06: qty 1

## 2022-12-06 MED ORDER — HYDROMORPHONE HCL 1 MG/ML IJ SOLN
0.2500 mg | INTRAMUSCULAR | Status: DC | PRN
  Administered 2022-12-06 (×3): 0.5 mg via INTRAVENOUS

## 2022-12-06 MED ORDER — INSULIN ASPART 100 UNIT/ML IJ SOLN
0.0000 [IU] | INTRAMUSCULAR | Status: DC
  Administered 2022-12-06 (×2): 2 [IU] via SUBCUTANEOUS
  Administered 2022-12-07: 1 [IU] via SUBCUTANEOUS
  Administered 2022-12-07: 2 [IU] via SUBCUTANEOUS
  Administered 2022-12-07 – 2022-12-09 (×6): 1 [IU] via SUBCUTANEOUS
  Administered 2022-12-10: 2 [IU] via SUBCUTANEOUS

## 2022-12-06 MED ORDER — ACETAMINOPHEN 10 MG/ML IV SOLN
INTRAVENOUS | Status: DC | PRN
Start: 2022-12-06 — End: 2022-12-06
  Administered 2022-12-06: 1000 mg via INTRAVENOUS

## 2022-12-06 MED ORDER — PHENYLEPHRINE HCL-NACL 20-0.9 MG/250ML-% IV SOLN
INTRAVENOUS | Status: AC
  Filled 2022-12-06: qty 250

## 2022-12-06 MED ORDER — EPHEDRINE SULFATE-NACL 50-0.9 MG/10ML-% IV SOSY
PREFILLED_SYRINGE | INTRAVENOUS | Status: DC | PRN
  Administered 2022-12-06 (×2): 5 mg via INTRAVENOUS

## 2022-12-06 MED ORDER — MIDAZOLAM HCL 2 MG/2ML IJ SOLN
INTRAMUSCULAR | Status: DC | PRN
  Administered 2022-12-06: 2 mg via INTRAVENOUS

## 2022-12-06 MED ORDER — DEXMEDETOMIDINE HCL IN NACL 80 MCG/20ML IV SOLN
INTRAVENOUS | Status: DC | PRN
Start: 2022-12-06 — End: 2022-12-06
  Administered 2022-12-06: 12 ug via INTRAVENOUS
  Administered 2022-12-06: 8 ug via INTRAVENOUS

## 2022-12-06 MED ORDER — BUPIVACAINE-EPINEPHRINE (PF) 0.25% -1:200000 IJ SOLN
INTRAMUSCULAR | Status: DC | PRN
  Administered 2022-12-06: 12 mL

## 2022-12-06 MED ORDER — ONDANSETRON HCL 4 MG/2ML IJ SOLN
4.0000 mg | Freq: Four times a day (QID) | INTRAMUSCULAR | Status: DC | PRN
  Administered 2022-12-09: 4 mg via INTRAVENOUS

## 2022-12-06 MED ORDER — ORAL CARE MOUTH RINSE: 15.0000 mL | Freq: Once | OROMUCOSAL | Status: AC

## 2022-12-06 MED ORDER — 0.9 % SODIUM CHLORIDE (POUR BTL) OPTIME
TOPICAL | Status: DC | PRN
  Administered 2022-12-06: 1000 mL

## 2022-12-06 MED ORDER — PHENYLEPHRINE 80 MCG/ML (10ML) SYRINGE FOR IV PUSH (FOR BLOOD PRESSURE SUPPORT)
PREFILLED_SYRINGE | INTRAVENOUS | Status: DC | PRN
  Administered 2022-12-06: 160 ug via INTRAVENOUS

## 2022-12-06 MED ORDER — HYDROMORPHONE HCL 1 MG/ML IJ SOLN
INTRAMUSCULAR | Status: DC | PRN
Start: 2022-12-06 — End: 2022-12-06
  Administered 2022-12-06: .5 mg via INTRAVENOUS

## 2022-12-06 MED ORDER — ROSUVASTATIN CALCIUM 20 MG PO TABS
20.0000 mg | ORAL_TABLET | Freq: Every day | ORAL | Status: DC
  Administered 2022-12-06 – 2022-12-09 (×4): 20 mg via ORAL
  Filled 2022-12-06 (×4): qty 1

## 2022-12-06 MED ORDER — DIPHENHYDRAMINE HCL 50 MG/ML IJ SOLN: 12.5000 mg | Freq: Four times a day (QID) | INTRAMUSCULAR | Status: DC | PRN

## 2022-12-06 MED ORDER — HEMOSTATIC AGENTS (NO CHARGE) OPTIME
TOPICAL | Status: DC | PRN
Start: 2022-12-06 — End: 2022-12-06
  Administered 2022-12-06: 3 via TOPICAL

## 2022-12-06 MED ORDER — DEXTROSE-SODIUM CHLORIDE 5-0.45 % IV SOLN: INTRAVENOUS | Status: AC

## 2022-12-06 MED ORDER — SUGAMMADEX SODIUM 200 MG/2ML IV SOLN
INTRAVENOUS | Status: DC | PRN
  Administered 2022-12-06: 400 mg via INTRAVENOUS

## 2022-12-06 MED ORDER — MIDAZOLAM HCL 2 MG/2ML IJ SOLN
INTRAMUSCULAR | Status: AC
  Filled 2022-12-06: qty 2

## 2022-12-06 MED ORDER — HYDROMORPHONE HCL 1 MG/ML IJ SOLN
INTRAMUSCULAR | Status: AC
  Filled 2022-12-06: qty 0.5

## 2022-12-06 MED ORDER — ENSURE PRE-SURGERY PO LIQD: 296.0000 mL | Freq: Once | ORAL | Status: DC

## 2022-12-06 MED ORDER — SODIUM CHLORIDE 0.9 % IV SOLN: INTRAVENOUS | Status: DC | PRN

## 2022-12-06 MED ORDER — OXYCODONE HCL 5 MG PO TABS
5.0000 mg | ORAL_TABLET | ORAL | Status: DC | PRN
  Administered 2022-12-07 (×2): 5 mg via ORAL
  Administered 2022-12-07 (×2): 10 mg via ORAL
  Administered 2022-12-08: 5 mg via ORAL
  Administered 2022-12-08 – 2022-12-10 (×4): 10 mg via ORAL
  Filled 2022-12-06 (×5): qty 2
  Filled 2022-12-06: qty 1
  Filled 2022-12-06: qty 2
  Filled 2022-12-06: qty 1
  Filled 2022-12-06: qty 2

## 2022-12-06 MED ORDER — SODIUM CHLORIDE 0.9 % IR SOLN
Status: DC | PRN
Start: 2022-12-06 — End: 2022-12-06
  Administered 2022-12-06: 1000 mL

## 2022-12-06 MED ORDER — BUPIVACAINE LIPOSOME 1.3 % IJ SUSP
INTRAMUSCULAR | Status: AC
  Filled 2022-12-06: qty 20

## 2022-12-06 MED ORDER — ONDANSETRON HCL 4 MG/2ML IJ SOLN: 4.0000 mg | Freq: Once | INTRAMUSCULAR | Status: DC | PRN

## 2022-12-06 MED ORDER — ROCURONIUM BROMIDE 10 MG/ML (PF) SYRINGE
PREFILLED_SYRINGE | INTRAVENOUS | Status: DC | PRN
  Administered 2022-12-06: 30 mg via INTRAVENOUS
  Administered 2022-12-06: 20 mg via INTRAVENOUS
  Administered 2022-12-06: 30 mg via INTRAVENOUS
  Administered 2022-12-06: 50 mg via INTRAVENOUS

## 2022-12-06 MED ORDER — CHLORHEXIDINE GLUCONATE 0.12 % MT SOLN
15.0000 mL | Freq: Once | OROMUCOSAL | Status: AC
  Administered 2022-12-06: 15 mL via OROMUCOSAL
  Filled 2022-12-06: qty 15

## 2022-12-06 MED ORDER — ACETAMINOPHEN 500 MG PO TABS
1000.0000 mg | ORAL_TABLET | Freq: Three times a day (TID) | ORAL | Status: DC
  Administered 2022-12-06 – 2022-12-10 (×12): 1000 mg via ORAL
  Filled 2022-12-06 (×12): qty 2

## 2022-12-06 MED ORDER — FENTANYL CITRATE (PF) 250 MCG/5ML IJ SOLN
INTRAMUSCULAR | Status: DC | PRN
  Administered 2022-12-06 (×2): 25 ug via INTRAVENOUS
  Administered 2022-12-06: 50 ug via INTRAVENOUS

## 2022-12-06 MED ORDER — BUPROPION HCL ER (XL) 300 MG PO TB24
300.0000 mg | ORAL_TABLET | Freq: Every morning | ORAL | Status: DC
  Administered 2022-12-07 – 2022-12-10 (×4): 300 mg via ORAL
  Filled 2022-12-06 (×4): qty 1

## 2022-12-06 MED ORDER — ALPRAZOLAM 0.5 MG PO TABS: 0.5000 mg | ORAL_TABLET | Freq: Three times a day (TID) | ORAL | Status: DC | PRN

## 2022-12-06 MED ORDER — ONDANSETRON HCL 4 MG/2ML IJ SOLN
INTRAMUSCULAR | Status: DC | PRN
  Administered 2022-12-06: 4 mg via INTRAVENOUS

## 2022-12-06 MED ORDER — BUPIVACAINE-EPINEPHRINE (PF) 0.25% -1:200000 IJ SOLN
INTRAMUSCULAR | Status: DC | PRN
  Administered 2022-12-06: 38 mL via SURGICAL_CAVITY

## 2022-12-06 MED ORDER — HYDROMORPHONE HCL 1 MG/ML IJ SOLN
0.5000 mg | INTRAMUSCULAR | Status: DC | PRN
  Administered 2022-12-07 – 2022-12-10 (×2): 0.5 mg via INTRAVENOUS
  Filled 2022-12-06 (×2): qty 0.5

## 2022-12-06 MED ORDER — PROPOFOL 10 MG/ML IV BOLUS
INTRAVENOUS | Status: AC
  Filled 2022-12-06: qty 20

## 2022-12-06 MED ORDER — AMISULPRIDE (ANTIEMETIC) 5 MG/2ML IV SOLN: 10.0000 mg | Freq: Once | INTRAVENOUS | Status: DC | PRN

## 2022-12-06 MED ORDER — LIDOCAINE 2% (20 MG/ML) 5 ML SYRINGE
INTRAMUSCULAR | Status: DC | PRN
  Administered 2022-12-06: 70 mg via INTRAVENOUS

## 2022-12-06 MED ORDER — FENTANYL CITRATE (PF) 100 MCG/2ML IJ SOLN
INTRAMUSCULAR | Status: AC
  Filled 2022-12-06: qty 2

## 2022-12-06 MED ORDER — METHOCARBAMOL 500 MG PO TABS
500.0000 mg | ORAL_TABLET | Freq: Four times a day (QID) | ORAL | Status: DC
  Administered 2022-12-06 – 2022-12-10 (×15): 500 mg via ORAL
  Filled 2022-12-06 (×14): qty 1

## 2022-12-06 MED ORDER — PROPOFOL 10 MG/ML IV BOLUS
INTRAVENOUS | Status: DC | PRN
  Administered 2022-12-06: 150 mg via INTRAVENOUS
  Administered 2022-12-06: 150 ug/kg/min via INTRAVENOUS

## 2022-12-06 MED ORDER — ENSURE PRE-SURGERY PO LIQD: 592.0000 mL | Freq: Once | ORAL | Status: DC

## 2022-12-06 MED ORDER — ONDANSETRON 4 MG PO TBDP: 4.0000 mg | ORAL_TABLET | Freq: Four times a day (QID) | ORAL | Status: DC | PRN

## 2022-12-06 SURGICAL SUPPLY — 131 items
ADH SKN CLS APL DERMABOND .7 (GAUZE/BANDAGES/DRESSINGS) ×1
APL PRP STRL LF DISP 70% ISPRP (MISCELLANEOUS) ×1
APL SRG 38 LTWT LNG FL B (MISCELLANEOUS)
APPLICATOR ARISTA FLEXITIP XL (MISCELLANEOUS) IMPLANT
APPLIER CLIP 11 MED OPEN (CLIP)
APPLIER CLIP 13 LRG OPEN (CLIP)
APPLIER CLIP 5 13 M/L LIGAMAX5 (MISCELLANEOUS)
APR CLP LRG 13 20 CLIP (CLIP)
APR CLP MED 11 20 MLT OPN (CLIP)
APR CLP MED LRG 5 ANG JAW (MISCELLANEOUS)
BAG BILE T-TUBES STRL (MISCELLANEOUS) IMPLANT
BAG COUNTER SPONGE SURGICOUNT (BAG) ×1 IMPLANT
BAG DRN 9.5 2 ADJ BELT ADPR (MISCELLANEOUS)
BAG SPEC RTRVL 10 TROC 200 (ENDOMECHANICALS) ×1
BAG SPNG CNTER NS LX DISP (BAG) ×1
BIOPATCH RED 1 DISK 7.0 (GAUZE/BANDAGES/DRESSINGS) ×1 IMPLANT
BLADE CLIPPER SURG (BLADE) IMPLANT
BLADE SURG 10 STRL SS (BLADE) ×1 IMPLANT
CANISTER SUCT 3000ML PPV (MISCELLANEOUS) ×1 IMPLANT
CHLORAPREP W/TINT 26 (MISCELLANEOUS) ×1 IMPLANT
CLIP APPLIE 11 MED OPEN (CLIP) IMPLANT
CLIP APPLIE 13 LRG OPEN (CLIP) IMPLANT
CLIP APPLIE 5 13 M/L LIGAMAX5 (MISCELLANEOUS) IMPLANT
CLIP LIGATING HEMO LOK XL GOLD (MISCELLANEOUS) IMPLANT
CLIP LIGATING HEMO O LOK GREEN (MISCELLANEOUS) IMPLANT
CLIP TI MEDIUM 24 (CLIP) IMPLANT
CLIP TI WIDE RED SMALL 24 (CLIP) IMPLANT
COVER SURGICAL LIGHT HANDLE (MISCELLANEOUS) ×1 IMPLANT
DEFOGGER SCOPE WARMER CLEARIFY (MISCELLANEOUS) IMPLANT
DERMABOND ADVANCED .7 DNX12 (GAUZE/BANDAGES/DRESSINGS) ×1 IMPLANT
DRAIN CHANNEL 19F RND (DRAIN) ×1 IMPLANT
DRAPE INCISE IOBAN 66X45 STRL (DRAPES) ×1 IMPLANT
DRAPE LAPAROSCOPIC ABDOMINAL (DRAPES) ×1 IMPLANT
DRAPE WARM FLUID 44X44 (DRAPES) ×1 IMPLANT
DRSG TEGADERM 4X4.5 CHG (GAUZE/BANDAGES/DRESSINGS) ×1 IMPLANT
DRSG TEGADERM 4X4.75 (GAUZE/BANDAGES/DRESSINGS) IMPLANT
ELECT BLADE 6.5 EXT (BLADE) ×1 IMPLANT
ELECT CAUTERY BLADE 6.4 (BLADE) ×1 IMPLANT
ELECT REM PT RETURN 9FT ADLT (ELECTROSURGICAL) ×1
ELECTRODE REM PT RTRN 9FT ADLT (ELECTROSURGICAL) ×1 IMPLANT
EVACUATOR SILICONE 100CC (DRAIN) IMPLANT
GAUZE SPONGE 2X2 8PLY STRL LF (GAUZE/BANDAGES/DRESSINGS) IMPLANT
GAUZE SPONGE 4X4 12PLY STRL (GAUZE/BANDAGES/DRESSINGS) ×1 IMPLANT
GEL PDS (MISCELLANEOUS) IMPLANT
GLOVE BIO SURGEON STRL SZ 6 (GLOVE) ×1 IMPLANT
GLOVE BIOGEL PI IND STRL 6 (GLOVE) ×1 IMPLANT
GLOVE BIOGEL PI IND STRL 8 (GLOVE) ×1 IMPLANT
GLOVE INDICATOR 6.5 STRL GRN (GLOVE) ×1 IMPLANT
GLOVE SURG POLY MICRO LF SZ5.5 (GLOVE) ×1 IMPLANT
GLOVE SURG UNDER POLY LF SZ6 (GLOVE) ×1 IMPLANT
GOWN STRL REUS W/ TWL LRG LVL3 (GOWN DISPOSABLE) ×3 IMPLANT
GOWN STRL REUS W/ TWL XL LVL3 (GOWN DISPOSABLE) ×1 IMPLANT
GOWN STRL REUS W/TWL LRG LVL3 (GOWN DISPOSABLE) ×3
GOWN STRL REUS W/TWL XL LVL3 (GOWN DISPOSABLE) ×1
HANDLE SUCTION POOLE (INSTRUMENTS) ×1 IMPLANT
HEMOSTAT ARISTA ABSORB 3G PWDR (HEMOSTASIS) IMPLANT
HEMOSTAT SNOW SURGICEL 2X4 (HEMOSTASIS) IMPLANT
IRRIG SUCT STRYKERFLOW 2 WTIP (MISCELLANEOUS) ×1
IRRIGATION SUCT STRKRFLW 2 WTP (MISCELLANEOUS) ×1 IMPLANT
KIT BASIN OR (CUSTOM PROCEDURE TRAY) ×1 IMPLANT
KIT TURNOVER KIT B (KITS) ×1 IMPLANT
L-HOOK LAP DISP 36CM (ELECTROSURGICAL)
LHOOK LAP DISP 36CM (ELECTROSURGICAL) IMPLANT
LIGASURE IMPACT 36 18CM CVD LR (INSTRUMENTS) ×1 IMPLANT
LOOP VASCULAR MINI 18 RED (MISCELLANEOUS) ×1
NDL 22X1.5 STRL (OR ONLY) (MISCELLANEOUS) ×1 IMPLANT
NDL INSUFFLATION 14GA 120MM (NEEDLE) ×1 IMPLANT
NEEDLE 22X1.5 STRL (OR ONLY) (MISCELLANEOUS) ×1
NEEDLE INSUFFLATION 14GA 120MM (NEEDLE) ×1
NS IRRIG 1000ML POUR BTL (IV SOLUTION) ×2 IMPLANT
PACK GENERAL/GYN (CUSTOM PROCEDURE TRAY) ×1 IMPLANT
PAD ARMBOARD 7.5X6 YLW CONV (MISCELLANEOUS) ×2 IMPLANT
PENCIL SMOKE EVACUATOR (MISCELLANEOUS) ×1 IMPLANT
POUCH LAPAROSCOPIC INSTRUMENT (MISCELLANEOUS) ×1 IMPLANT
POUCH RETRIEVAL ECOSAC 10 (ENDOMECHANICALS) IMPLANT
RELOAD STAPLE 60 2.6 WHT THN (STAPLE) IMPLANT
RELOAD STAPLE 60 4.1 GRN THCK (STAPLE) IMPLANT
SCISSORS LAP 5X35 DISP (ENDOMECHANICALS) IMPLANT
SET TUBE SMOKE EVAC HIGH FLOW (TUBING) ×1 IMPLANT
SHEARS HARMONIC ACE PLUS 36CM (ENDOMECHANICALS) ×1 IMPLANT
SLEEVE Z-THREAD 5X100MM (TROCAR) ×3 IMPLANT
SPECIMEN JAR X LARGE (MISCELLANEOUS) ×1 IMPLANT
SPONGE INTESTINAL PEANUT (DISPOSABLE) IMPLANT
SPONGE SURGIFOAM ABS GEL 100 (HEMOSTASIS) IMPLANT
SPONGE T-LAP 18X18 ~~LOC~~+RFID (SPONGE) IMPLANT
STAPLE ECHEON FLEX 60 POW ENDO (STAPLE) ×1 IMPLANT
STAPLE LINE REINFORCEMENT LAP (STAPLE) IMPLANT
STAPLER RELOAD GREEN 60MM (STAPLE) ×2
STAPLER RELOAD WHITE 60MM (STAPLE)
STAPLER VISISTAT 35W (STAPLE) ×1 IMPLANT
SUCTION POOLE HANDLE (INSTRUMENTS) ×1
SUT ETHILON 2 0 FS 18 (SUTURE) IMPLANT
SUT MNCRL AB 4-0 PS2 18 (SUTURE) ×1 IMPLANT
SUT NOVA NAB DX-16 0-1 5-0 T12 (SUTURE) IMPLANT
SUT PDS AB 1 CTX 36 (SUTURE) IMPLANT
SUT PDS AB 1 TP1 96 (SUTURE) ×2 IMPLANT
SUT PDS AB 3-0 SH 27 (SUTURE) IMPLANT
SUT PDS II 0 TP-1 LOOPED 60 (SUTURE) IMPLANT
SUT PROLENE 2 0 SH 30 (SUTURE) ×1 IMPLANT
SUT PROLENE 3 0 SH 1 (SUTURE) ×1 IMPLANT
SUT PROLENE 3 0 SH 48 (SUTURE) IMPLANT
SUT PROLENE 4 0 RB 1 (SUTURE) ×1
SUT PROLENE 4 0 SH DA (SUTURE) IMPLANT
SUT PROLENE 4-0 RB1 .5 CRCL 36 (SUTURE) IMPLANT
SUT PROLENE 5 0 C1 (SUTURE) IMPLANT
SUT SILK 0 TIES 10X30 (SUTURE) ×1 IMPLANT
SUT SILK 2 0 SH (SUTURE) ×2 IMPLANT
SUT SILK 2 0 TIES 10X30 (SUTURE) ×1 IMPLANT
SUT SILK 2 0SH CR/8 30 (SUTURE) ×1 IMPLANT
SUT SILK 3 0 TIES 10X30 (SUTURE) IMPLANT
SUT SILK 3 0SH CR/8 30 (SUTURE) ×1 IMPLANT
SUT VIC AB 2-0 CT1 27 (SUTURE)
SUT VIC AB 2-0 CT1 TAPERPNT 27 (SUTURE) ×1 IMPLANT
SUT VIC AB 3-0 SH 18 (SUTURE) ×1 IMPLANT
SUT VIC AB 3-0 SH 27 (SUTURE)
SUT VIC AB 3-0 SH 27X BRD (SUTURE) ×1 IMPLANT
SUT VICRYL 0 AB UR-6 (SUTURE) IMPLANT
SUT VICRYL 0 UR6 27IN ABS (SUTURE) IMPLANT
SYS LAPSCP GELPORT 120MM (MISCELLANEOUS)
SYSTEM LAPSCP GELPORT 120MM (MISCELLANEOUS) ×1 IMPLANT
TOWEL GREEN STERILE (TOWEL DISPOSABLE) ×1 IMPLANT
TOWEL GREEN STERILE FF (TOWEL DISPOSABLE) ×1 IMPLANT
TRAY FOLEY MTR SLVR 14FR STAT (SET/KITS/TRAYS/PACK) ×1 IMPLANT
TRAY LAPAROSCOPIC MC (CUSTOM PROCEDURE TRAY) ×1 IMPLANT
TROCAR BALLN 12MMX100 BLUNT (TROCAR) IMPLANT
TROCAR Z THREAD OPTICAL 12X100 (TROCAR) ×1 IMPLANT
TROCAR Z-THREAD OPTICAL 5X100M (TROCAR) ×1 IMPLANT
TUBE CONNECTING 12X1/4 (SUCTIONS) IMPLANT
VASCULAR TIE MINI RED 18IN STL (MISCELLANEOUS) ×1 IMPLANT
WARMER LAPAROSCOPE (MISCELLANEOUS) ×1 IMPLANT
YANKAUER SUCT BULB TIP NO VENT (SUCTIONS) IMPLANT

## 2022-12-06 NOTE — Anesthesia Procedure Notes (Signed)
Procedure Name: Intubation Date/Time: 12/06/2022 9:30 AM  Performed by: Kayleen Memos, CRNAPre-anesthesia Checklist: Patient identified, Emergency Drugs available, Suction available and Patient being monitored Patient Re-evaluated:Patient Re-evaluated prior to induction Oxygen Delivery Method: Circle System Utilized Preoxygenation: Pre-oxygenation with 100% oxygen Induction Type: IV induction Ventilation: Mask ventilation without difficulty Laryngoscope Size: Mac and 4 Grade View: Grade I Tube type: Oral Tube size: 7.5 mm Number of attempts: 1 Airway Equipment and Method: Stylet and Oral airway Placement Confirmation: ETT inserted through vocal cords under direct vision, positive ETCO2 and breath sounds checked- equal and bilateral Secured at: 22 cm Tube secured with: Tape Dental Injury: Teeth and Oropharynx as per pre-operative assessment

## 2022-12-06 NOTE — Anesthesia Postprocedure Evaluation (Signed)
Anesthesia Post Note  Patient: Angel Lawson  Procedure(s) Performed: LAPAROSCOPIC DISTAL PANCREATECTOMY     Patient location during evaluation: PACU Anesthesia Type: General Level of consciousness: awake and alert Pain management: pain level controlled Vital Signs Assessment: post-procedure vital signs reviewed and stable Respiratory status: spontaneous breathing, nonlabored ventilation and respiratory function stable Cardiovascular status: blood pressure returned to baseline and stable Postop Assessment: no apparent nausea or vomiting Anesthetic complications: no   No notable events documented.  Last Vitals:  Vitals:   12/06/22 1245 12/06/22 1300  BP: 90/64 112/71  Pulse: 70 76  Resp: 14 18  Temp:    SpO2: 97% 99%    Last Pain:  Vitals:   12/06/22 1230  TempSrc:   PainSc: Asleep                 [name] A.

## 2022-12-06 NOTE — Transfer of Care (Signed)
Immediate Anesthesia Transfer of Care Note  Patient: Angel Lawson  Procedure(s) Performed: LAPAROSCOPIC DISTAL PANCREATECTOMY  Patient Location: PACU  Anesthesia Type:General  Level of Consciousness: sedated  Airway & Oxygen Therapy: Patient Spontanous Breathing and Patient connected to face mask oxygen  Post-op Assessment: Report given to RN and Post -op Vital signs reviewed and stable  Post vital signs: Reviewed and stable  Last Vitals:  Vitals Value Taken Time  BP 109/71 12/06/22 1230  Temp    Pulse 68 12/06/22 1232  Resp 15 12/06/22 1232  SpO2 96 % 12/06/22 1232  Vitals shown include unfiled device data.  Last Pain:  Vitals:   12/06/22 0741  TempSrc:   PainSc: 0-No pain         Complications: No notable events documented.

## 2022-12-06 NOTE — Op Note (Signed)
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 His cyst has been followed and had interval growth, and he underwent an EUS with FNA. Fluid analysis was consistent with a mucinous cystic neoplasm. Surgical resection was recommend, and after a discussion of the risks and benefits, he agreed to undergo distal pancreatectomy.  Findings: Lobulated cystic mass in the body/tail of the pancreas. No overt signs of malignancy. The spleen and splenic vessels were preserved.  Procedure details: Informed consent was obtained in the preoperative area prior to the procedure. The patient was brought to the operating room and placed on the table in the supine position. General anesthesia was induced and appropriate lines and drains were placed for intraoperative monitoring. Perioperative antibiotics were administered per SCIP guidelines. The abdomen was prepped and draped in the usual sterile fashion. A pre-procedure timeout was taken verifying patient identity, surgical site and procedure to be performed.  A small skin incision was made in the LUQ at Palmer's point, the fascia was grasped and elevated, and a Veress needle was inserted through the fascia.  Intraperitoneal placement was confirmed with the saline drop test and the abdomen was insufflated.  A 5 mm Visiport was placed, and the peritoneal cavity was inspected with no evidence of visceral or vascular injury.   Additional 5 mm ports were placed in the supraumbilical position, in the midline epigastric area, and in the left upper quadrant lateral to the first port, all under direct visualization.  The stomach was elevated and the gastrocolic omentum was opened with harmonic shears to enter the lesser sac.  This dissection was carried proximally along the greater curve of the stomach, stopping prior to the short gastric vessels, which were preserved.  The Nathanson retractor was placed in the subxiphoid area and used to elevate the stomach off of the pancreas. The inferior border of the pancreas was identified along the body and dissected out using blunt dissection and harmonic shears.  The cystic mass was visible at the juncture of the body and tail of the pancreas.  The cyst was very carefully dissected off the retroperitoneum, taking care not to violate the cyst wall.  An intraoperative ultrasound of the pancreas was performed to evaluate the cyst (see photo below).  The posterior body and tail of the pancreas were then elevated off the retroperitoneum using blunt dissection and LigaSure.  The splenic vein was identified on the posterior pancreas.  The pancreatic parenchyma was gently elevated off of the splenic vein on the body of the pancreas using blunt dissection.  This created a window between the pancreas and the splenic vein.  The splenic artery then became visible on the superior border of the pancreas.  A window was created bluntly between the splenic artery and the pancreatic parenchyma.  The supraumbilical port was upsized to a 12 mm port.  The pancreas was then divided on the body using two fires of a 60 mm powered stapler with a green load reinforced with Peri-Strips, taking care not to include the splenic  vessels and the staple load.  Once the pancreas was divided, the distal pancreas was retracted laterally to expose the splenic vessels.  The pancreas was very carefully dissected off the splenic vessels  using gentle blunt dissection and harmonic shears.  Visible branch vessels off the splenic vessels were circumferentially dissected out and Hem-o-lok clips were placed prior to division of the branch vessels.  The pancreas was tediously dissected off the splenic vessels working distally toward the splenic hilum.  Once the pancreas was freed from the splenic vessels, the tail was further separated from the retroperitoneum using harmonic shears.  The specimen was placed in an Ecosac and extracted via the supraumbilical port site.  This required extension of the port site skin and fascial incisions.  The surgical site was examined and there was some oozing on the retroperitoneal surface.  A sheet of Surgicel snow was applied and pressure was held with a Ray-Tec for approximately 5 minutes.  The Ray-Tec was removed and the surgical site appeared hemostatic.  The abdomen was irrigated and there were no other sites of bleeding.  The staple line and the remnant pancreas appeared intact and hemostatic.  An additional sheet of Surgicel snow was placed on the retroperitoneum to help maintain hemostasis.  A 19-Fr round JP drain was placed adjacent to the remnant pancreas and brought out through the most lateral port site in the left upper quadrant.  It was secured to the skin using a 2-0 nylon suture.  The Nathanson retractor was removed and the stomach was placed back in proper anatomic position.  The ports were removed and the abdomen was desufflated.  The fascia at the umbilical port site was closed with 0 Vicryl figure-of-eight sutures.  The skin at all port sites was closed with subcuticular 4-0 Monocryl suture.  Dermabond was applied.  The patient tolerated the procedure well with no apparent complications.  All counts were correct x2 at the end of the procedure. The patient was extubated and taken to PACU in stable condition.  Sophronia Simas, MD 12/06/22 12:33 PM   Intraoperative US showing pancreatic cyst:

## 2022-12-06 NOTE — Discharge Instructions (Addendum)
[facility] DISCHARGE INSTRUCTIONS  Activity No heavy lifting greater than 15 pounds for 6 weeks after surgery. Ok to shower, but do not bathe or submerge incisions underwater. Do not drive while taking narcotic pain medication. You may drive when you are no longer taking prescription pain medication, you can comfortably wear a seatbelt, and you can safely maneuver your car and apply brakes.  Wound Care Your incisions are covered with skin glue called Dermabond. This will peel off on its own over time. You may shower and allow warm soapy water to run over your incisions. Gently pat dry. Do not submerge your incision underwater until cleared by your surgeon. Monitor your incision for any new redness, tenderness, or drainage. Many patients will experience some swelling and bruising at the incisions.  Ice packs will help.  Swelling and bruising can take several days to resolve.   Drain Care JP DRAIN CARE INSTRUCTIONS Wash your hands prior to caring for your drain. Uncap the bulb to release the suction. Pour out the bulb contents into a measuring cup and record the amount. Squeeze the bulb and replace the cap to apply suction again. You should empty your drain each time you notice the bulb is full. Empty at least once a day and more often when the bulb fills up.  Please keep a daily log of your drain output and bring this with you when you come to clinic.  Medications A  prescription for pain medication may be given to you upon discharge.  Take your pain medication as prescribed, if needed.  If narcotic pain medicine is not needed, then you may take acetaminophen (Tylenol) or ibuprofen (Advil) as needed. It is common to experience some constipation if taking pain medication after surgery.  Increasing fluid intake and taking a stool softener (such as Colace) will usually help or prevent this problem from occurring.  A mild laxative (Milk of Magnesia or Miralax) should be taken  according to package directions if there are no bowel movements after 48 hours. Take your usually prescribed medications unless otherwise directed. If you need a refill on your pain medication, please contact your pharmacy.  They will contact our office to request authorization. Prescriptions will not be filled after 5 pm or on weekends.  When to Call us: Fever greater than 100.5 New redness, drainage, or swelling at incision site Severe pain, nausea, or vomiting Persistent bleeding from incisions Jaundice (yellowing of the whites of the eyes or skin)  Follow-up You have an appointment scheduled with Dr. Freida Busman on January 01, 2023 at 9:20am. This will be at the [facility] office at [address], [place], [place]. Please arrive at least 15 minutes prior to your scheduled appointment time.  IF YOU HAVE DISABILITY OR FAMILY LEAVE FORMS, YOU MUST BRING THEM TO THE OFFICE FOR PROCESSING.   DO NOT GIVE THEM TO YOUR DOCTOR.  The clinic staff is available to answer your questions during regular business hours.  Please don't hesitate to call and ask to speak to one of the nurses for clinical concerns.  If you have a medical emergency, go to the nearest emergency room or call 911.  A surgeon from [facility] is always on call at the hospital  [address], [place], [place]  [address] ?  [address] [address], [place], [place]   [address] [phone] ? Toll Free: [phone] ? FAX [phone] Web site: [email or web address]  Managing Your Pain After Surgery Without Opioids    Thank you for participating in our program to help patients manage their pain after surgery without opioids. This is part of our effort to provide you with the best care possible, without exposing you or your family to the risk that opioids pose.  What pain can I expect after surgery? You can expect to have some pain after surgery. This is normal. The  pain is typically worse the day after surgery, and quickly begins to get better. Many studies have found that many patients are able to manage their pain after surgery with Over-the-Counter (OTC) medications such as Tylenol and Motrin. If you have a condition that does not allow you to take Tylenol or Motrin, notify your surgical team.  How will I manage my pain? The best strategy for controlling your pain after surgery is around the clock pain control with Tylenol (acetaminophen) and Motrin (ibuprofen or Advil). Alternating these medications with each other allows you to maximize your pain control. In addition to Tylenol and Motrin, you can use heating pads or ice packs on your incisions to help reduce your pain.  How will I alternate your regular strength over-the-counter pain medication? You will take a dose of pain medication every three hours. Start by taking 650 mg of Tylenol (2 pills of 325 mg) 3 hours later take 600 mg of Motrin (3 pills of 200 mg) 3 hours after taking the Motrin take 650 mg of Tylenol 3 hours after that take 600 mg of Motrin.   - 1 -  See example - if your first dose of Tylenol is at 12:00 PM   12:00 PM Tylenol 650 mg (2 pills of 325 mg)  3:00 PM Motrin 600 mg (3 pills of 200 mg)  6:00 PM Tylenol 650 mg (2 pills of 325 mg)  9:00 PM Motrin 600 mg (3 pills of 200 mg)  Continue alternating every 3 hours   We recommend that you follow this schedule around-the-clock for at least 3 days after surgery, or until you feel that it is no longer needed. Use the table on the last page of this handout to keep track of the medications you are taking. Important: Do not take more than 3000mg  of Tylenol or 3200mg  of Motrin in a 24-hour period. Do not take ibuprofen/Motrin if you have a history of bleeding stomach ulcers, severe kidney disease, &/or actively taking a blood thinner  What if I still have pain? If you have pain that is not controlled with the over-the-counter pain  medications (Tylenol and Motrin or Advil) you might have what we call "breakthrough" pain. You will receive a prescription for a small amount of an opioid pain medication such as Oxycodone, Tramadol, or Tylenol with Codeine. Use these opioid pills in the first 24 hours after surgery if you have breakthrough pain. Do not take more than 1 pill every 4-6 hours.  If you still have uncontrolled pain after using all opioid pills, don't hesitate to call our staff using the number provided. We will help make sure you are managing your pain in the best way possible, and if necessary, we can provide a prescription for additional pain medication.   Day 1    Time  Name of Medication Number of pills taken  Amount of Acetaminophen  Pain Level   Comments  AM PM       AM PM       AM PM  AM PM       AM PM       AM PM       AM PM       AM PM       Total Daily amount of Acetaminophen Do not take more than  3,000 mg per day      Day 2    Time  Name of Medication Number of pills taken  Amount of Acetaminophen  Pain Level   Comments  AM PM       AM PM       AM PM       AM PM       AM PM       AM PM       AM PM       AM PM       Total Daily amount of Acetaminophen Do not take more than  3,000 mg per day      Day 3    Time  Name of Medication Number of pills taken  Amount of Acetaminophen  Pain Level   Comments  AM PM       AM PM       AM PM       AM PM         AM PM       AM PM       AM PM       AM PM       Total Daily amount of Acetaminophen Do not take more than  3,000 mg per day      Day 4    Time  Name of Medication Number of pills taken  Amount of Acetaminophen  Pain Level   Comments  AM PM       AM PM       AM PM       AM PM       AM PM       AM PM       AM PM       AM PM       Total Daily amount of Acetaminophen Do not take more than  3,000 mg per day      Day 5    Time  Name of Medication Number of pills taken  Amount of Acetaminophen   Pain Level   Comments  AM PM       AM PM       AM PM       AM PM       AM PM       AM PM       AM PM       AM PM       Total Daily amount of Acetaminophen Do not take more than  3,000 mg per day      Day 6    Time  Name of Medication Number of pills taken  Amount of Acetaminophen  Pain Level  Comments  AM PM       AM PM       AM PM       AM PM       AM PM       AM PM       AM PM       AM PM       Total Daily amount of Acetaminophen Do not take more than  3,000 mg  per day      Day 7    Time  Name of Medication Number of pills taken  Amount of Acetaminophen  Pain Level   Comments  AM PM       AM PM       AM PM       AM PM       AM PM       AM PM       AM PM       AM PM       Total Daily amount of Acetaminophen Do not take more than  3,000 mg per day        For additional information about how and where to safely dispose of unused opioid medications - [email or web address]  Disclaimer: This document contains information and/or instructional materials adapted from [facility] for the typical patient with your condition. It does not replace medical advice from your health care provider because your experience may differ from that of the typical patient. Talk to your health care provider if you have any questions about this document, your condition or your treatment plan. Adapted from [facility]

## 2022-12-06 NOTE — Anesthesia Preprocedure Evaluation (Addendum)
Anesthesia Evaluation  Patient identified by MRN, date of birth, ID band Patient awake    Reviewed: Allergy & Precautions, NPO status , Patient's Chart, lab work & pertinent test results  Airway Mallampati: II  TM Distance: >3 FB Neck ROM: Full    Dental no notable dental hx. (+) Caps, Dental Advisory Given, Teeth Intact   Pulmonary neg pulmonary ROS   Pulmonary exam normal breath sounds clear to auscultation       Cardiovascular negative cardio ROS Normal cardiovascular exam Rhythm:Regular Rate:Normal  EKG 11/28/22 NSR, LAD, RBBB pattern unchanged from previous tracing   Neuro/Psych  PSYCHIATRIC DISORDERS Anxiety     negative neurological ROS     GI/Hepatic Neg liver ROS,GERD  Medicated,,Mucinous cystic neoplasm of pancreas   Endo/Other  Hyperlipidemia  Renal/GU negative Renal ROS  negative genitourinary   Musculoskeletal  (+) Arthritis , Osteoarthritis,    Abdominal   Peds  Hematology negative hematology ROS (+)   Anesthesia Other Findings   Reproductive/Obstetrics Hypogonadism ED                             Anesthesia Physical Anesthesia Plan  ASA: 2  Anesthesia Plan: General   Post-op Pain Management: Precedex, Ofirmev IV (intra-op)* and Dilaudid IV   Induction: Intravenous  PONV Risk Score and Plan: 4 or greater and Treatment may vary due to age or medical condition, Ondansetron and Dexamethasone  Airway Management Planned: Oral ETT  Additional Equipment: None  Intra-op Plan:   Post-operative Plan: Extubation in OR  Informed Consent: I have reviewed the patients History and Physical, chart, labs and discussed the procedure including the risks, benefits and alternatives for the proposed anesthesia with the patient or authorized representative who has indicated his/her understanding and acceptance.     Dental advisory given  Plan Discussed with: CRNA and  Anesthesiologist  Anesthesia Plan Comments: (If Whipple to be done will place A. Line intraop)        Anesthesia Quick Evaluation

## 2022-12-06 NOTE — H&P (Signed)
Angel Lawson is an 67 y.o. male.   Chief Complaint: pancreatic cyst HPI: Angel Lawson is a 67 yo male with a mucinous cystic neoplasm in the tail of the pancreas. This has been followed and demonstrated slow enlargement, after which he was referred for an EUS. This was done in April of this year, and fluid analysis was consistent with an MCN. He presents today for surgical resection. He has had no clinical changes since has last office visit with me on 9/17. He recently had a follow up CT scan that showed a stable septated cyst in the tail of the pancreas.  Past Medical History:  Diagnosis Date   Anxiety    Arthritis    GERD (gastroesophageal reflux disease)    High cholesterol     Past Surgical History:  Procedure Laterality Date   COLONOSCOPY  2024   ESOPHAGOGASTRODUODENOSCOPY (EGD) WITH PROPOFOL N/A 06/07/2022   Procedure: ESOPHAGOGASTRODUODENOSCOPY (EGD) WITH PROPOFOL;  Surgeon: Jeani Hawking, MD;  Location: [facility];  Service: Gastroenterology;  Laterality: N/A;   FINE NEEDLE ASPIRATION N/A 06/07/2022   Procedure: FINE NEEDLE ASPIRATION (FNA) LINEAR;  Surgeon: Jeani Hawking, MD;  Location: [facility];  Service: Gastroenterology;  Laterality: N/A;   LAPAROSCOPIC CHOLECYSTECTOMY SINGLE SITE WITH INTRAOPERATIVE CHOLANGIOGRAM N/A 07/10/2021   Procedure: LAPAROSCOPIC CHOLECYSTECTOMY SINGLE SITE WITH INTRAOPERATIVE CHOLANGIOGRAM AND PRIMARY UMBILICAL HERNIA REPAIR;  Surgeon: Karie Soda, MD;  Location: [facility];  Service: General;  Laterality: N/A;   UPPER ESOPHAGEAL ENDOSCOPIC ULTRASOUND (EUS) N/A 06/07/2022   Procedure: UPPER ESOPHAGEAL ENDOSCOPIC ULTRASOUND (EUS);  Surgeon: Jeani Hawking, MD;  Location: [facility];  Service: Gastroenterology;  Laterality: N/A;    History reviewed. No pertinent family history. Social History:  reports that he has never smoked. He has never used smokeless tobacco. He reports that he does not drink alcohol and does not use drugs.  Allergies: No  Known Allergies  Medications Prior to Admission  Medication Sig Dispense Refill   ALPRAZolam (XANAX) 0.5 MG tablet Take 0.5 mg by mouth 3 (three) times daily as needed for anxiety or sleep.     buPROPion (WELLBUTRIN XL) 300 MG 24 hr tablet Take 300 mg by mouth every morning.     fluticasone (FLONASE) 50 MCG/ACT nasal spray Place 1 spray into both nostrils at bedtime as needed (seasonal allergies).     ibuprofen (ADVIL) 200 MG tablet Take 400 mg by mouth daily as needed for headache or moderate pain (pain).     Multiple Vitamin (MULTIVITAMIN WITH MINERALS) TABS tablet Take 1 tablet by mouth in the morning.     rosuvastatin (CRESTOR) 20 MG tablet Take 20 mg by mouth at bedtime.     tadalafil (CIALIS) 5 MG tablet Take 5 mg by mouth every morning.     testosterone cypionate (DEPOTESTOSTERONE CYPIONATE) 200 MG/ML injection Inject 100 mg into the muscle every 21 ( twenty-one) days. Administered by Dr. Antony Haste      Results for orders placed or performed during the hospital encounter of 12/06/22 (from the past 48 hour(s))  Prepare RBC (crossmatch)     Status: None   Collection Time: 12/06/22  8:00 AM  Result Value Ref Range   Order Confirmation      ORDER PROCESSED BY BLOOD BANK Performed at [facility], [address], [place], [place] [address]   ABO/Rh     Status: None (Preliminary result)   Collection Time: 12/06/22  8:00 AM  Result Value Ref Range   ABO/RH(D) PENDING  No results found.  Review of Systems  Blood pressure 119/85, pulse 74, temperature 97.7 F (36.5 C), temperature source Oral, resp. rate 18, height 5\' 9"  (1.753 m), weight 74.8 kg, SpO2 100%. Physical Exam Vitals reviewed.  Constitutional:      Appearance: Normal appearance.  HENT:     Head: Normocephalic and atraumatic.  Pulmonary:     Effort: Pulmonary effort is normal. No respiratory distress.  Abdominal:     General: There is no distension.     Palpations: Abdomen is soft.  Musculoskeletal:         General: Normal range of motion.  Skin:    General: Skin is warm and dry.     Coloration: Skin is not jaundiced.  Neurological:     General: No focal deficit present.     Mental Status: He is alert and oriented to person, place, and time.      Assessment/Plan 67 yo male with an MCN of the tail of the pancreas. Proceed to the OR for laparoscopic distal pancreatectomy, possible splenectomy. Procedure details have been reviewed, including the benefits and risks of bleeding, infection, and pancreatic fistula. All questions answered. Admit to inpatient postoperatively.  Fritzi Mandes, MD 12/06/2022, 8:44 AM

## 2022-12-06 NOTE — Anesthesia Procedure Notes (Signed)
Arterial Line Insertion Start/End10/18/2024 8:30 AM, 12/06/2022 8:35 AM Performed by: Randon Goldsmith, CRNA  Patient location: Pre-op. Preanesthetic checklist: patient identified, IV checked, site marked, risks and benefits discussed, surgical consent, monitors and equipment checked, pre-op evaluation, timeout performed and anesthesia consent Lidocaine 1% used for infiltration Left, radial was placed Catheter size: 20 G Hand hygiene performed , maximum sterile barriers used  and Seldinger technique used Allen's test indicative of satisfactory collateral circulation Attempts: 1 Procedure performed without using ultrasound guided technique. Following insertion, dressing applied. Post procedure assessment: normal and unchanged  Patient tolerated the procedure well with no immediate complications.

## 2022-12-07 ENCOUNTER — Encounter (HOSPITAL_COMMUNITY): Payer: Self-pay | Admitting: Surgery

## 2022-12-07 LAB — CBC
HCT: 42 % (ref 39.0–52.0)
Hemoglobin: 13.8 g/dL (ref 13.0–17.0)
MCH: 28.8 pg (ref 26.0–34.0)
MCHC: 32.9 g/dL (ref 30.0–36.0)
MCV: 87.5 fL (ref 80.0–100.0)
Platelets: 211 K/uL (ref 150–400)
RBC: 4.8 MIL/uL (ref 4.22–5.81)
RDW: 13.4 % (ref 11.5–15.5)
WBC: 8.7 K/uL (ref 4.0–10.5)
nRBC: 0 % (ref 0.0–0.2)

## 2022-12-07 LAB — BASIC METABOLIC PANEL
Anion gap: 16 — ABNORMAL HIGH (ref 5–15)
BUN: 7 mg/dL — ABNORMAL LOW (ref 8–23)
CO2: 23 mmol/L (ref 22–32)
Calcium: 9.5 mg/dL (ref 8.9–10.3)
Chloride: 102 mmol/L (ref 98–111)
Creatinine, Ser: 0.96 mg/dL (ref 0.61–1.24)
GFR, Estimated: >60 mL/min (ref >60)
Glucose, Bld: 124 mg/dL — ABNORMAL HIGH (ref 70–99)
Potassium: 3.7 mmol/L (ref 3.5–5.1)
Sodium: 141 mmol/L (ref 135–145)

## 2022-12-07 LAB — GLUCOSE, CAPILLARY
Glucose-Capillary: 138 mg/dL — ABNORMAL HIGH (ref 70–99)
Glucose-Capillary: 113 mg/dL — ABNORMAL HIGH (ref 70–99)
Glucose-Capillary: 124 mg/dL — ABNORMAL HIGH (ref 70–99)
Glucose-Capillary: 136 mg/dL — ABNORMAL HIGH (ref 70–99)
Glucose-Capillary: 116 mg/dL — ABNORMAL HIGH (ref 70–99)

## 2022-12-07 MED ORDER — ALPRAZOLAM 0.5 MG PO TABS: 0.5000 mg | ORAL_TABLET | Freq: Three times a day (TID) | ORAL | Status: DC | PRN

## 2022-12-07 NOTE — Progress Notes (Signed)
PT Cancellation Note  Patient Details Name: Angel Lawson MRN: [id number] DOB: Jun 25, 1955   Cancelled Treatment:    Reason Eval/Treat Not Completed: PT screened, no needs identified, will sign off (pt has been ambulating on unit with family, feels he is at baseline with no change in balance/mobility)   Renaldo Fiddler PT, DPT Acute Rehabilitation Services Office [phone]  12/07/22 5:04 PM

## 2022-12-07 NOTE — Progress Notes (Signed)
    1 Day Post-Op  Subjective: No acute issues overnight. Reports good pain control this morning. Denies nausea/vomiting.   Objective: Vital signs in last 24 hours: Temp:  [97.6 F (36.4 C)-99.1 F (37.3 C)] 98.6 F (37 C) (10/19 0436) Pulse Rate:  [68-96] 93 (10/19 0436) Resp:  [9-19] 17 (10/19 0436) BP: (90-159)/(64-93) 109/72 (10/19 0436) SpO2:  [94 %-100 %] 94 % (10/19 0436) Weight:  [74.8 kg] 74.8 kg (10/18 0736)    Intake/Output from previous day: 10/18 0701 - 10/19 0700 In: 3399.2 [I.V.:2599.2; IV Piggyback:800] Out: 2985 [Urine:2725; Drains:60; Blood:200] Intake/Output this shift: Total I/O In: 949.2 [I.V.:949.2] Out: 2260 [Urine:2225; Drains:35]  PE: General: resting comfortably, NAD Neuro: alert and oriented, no focal deficits Resp: normal work of breathing on room air Abdomen: soft, nondistended, nontender to palpation. Incisions clean and dry, mild ecchymosis at umbilical incision, no erythema or induration. LUQ JP serosanguinous. Extremities: warm and well-perfused GU: foley draining clear yellow urine   Lab Results:  Recent Labs    12/07/22 0249  WBC 8.7  HGB 13.8  HCT 42.0  PLT 211   BMET Recent Labs    12/07/22 0249  NA 141  K 3.7  CL 102  CO2 23  GLUCOSE 124*  BUN 7*  CREATININE 0.96  CALCIUM 9.5   PT/INR No results for input(s): "LABPROT", "INR" in the last 72 hours. 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 - Advance to clear liquid diet. Maintenance IV fluids to 108ml/hr, SLIV IV when taking PO. - Multimodal pain control - Remove foley - Ambulate - Drain amylase today and POD3 - VTE: lovenox, SCDs  - Dispo: inpatient, med-surg floor    LOS: 1 day    Sophronia Simas, MD [facility] Surgery General, Hepatobiliary and Pancreatic Surgery 12/07/22 5:08 AM

## 2022-12-07 NOTE — Progress Notes (Signed)
Foley removed.

## 2022-12-07 NOTE — Plan of Care (Signed)
  Problem: Nutrition: Goal: Adequate nutrition will be maintained Outcome: Progressing   Problem: Pain Managment: Goal: General experience of comfort will improve Outcome: Progressing   Problem: Safety: Goal: Ability to remain free from injury will improve Outcome: Progressing   Problem: Skin Integrity: Goal: Risk for impaired skin integrity will decrease Outcome: Progressing   Problem: Nutritional: Goal: Maintenance of adequate nutrition will improve Outcome: Progressing

## 2022-12-08 LAB — BASIC METABOLIC PANEL
Anion gap: 11 (ref 5–15)
BUN: 11 mg/dL (ref 8–23)
CO2: 26 mmol/L (ref 22–32)
Calcium: 8.3 mg/dL — ABNORMAL LOW (ref 8.9–10.3)
Chloride: 100 mmol/L (ref 98–111)
Creatinine, Ser: 0.94 mg/dL (ref 0.61–1.24)
GFR, Estimated: >60 mL/min (ref >60)
Glucose, Bld: 108 mg/dL — ABNORMAL HIGH (ref 70–99)
Potassium: 3.8 mmol/L (ref 3.5–5.1)
Sodium: 137 mmol/L (ref 135–145)

## 2022-12-08 LAB — GLUCOSE, CAPILLARY
Glucose-Capillary: 105 mg/dL — ABNORMAL HIGH (ref 70–99)
Glucose-Capillary: 116 mg/dL — ABNORMAL HIGH (ref 70–99)
Glucose-Capillary: 117 mg/dL — ABNORMAL HIGH (ref 70–99)
Glucose-Capillary: 117 mg/dL — ABNORMAL HIGH (ref 70–99)
Glucose-Capillary: 121 mg/dL — ABNORMAL HIGH (ref 70–99)
Glucose-Capillary: 127 mg/dL — ABNORMAL HIGH (ref 70–99)

## 2022-12-08 LAB — CBC
HCT: 42.2 % (ref 39.0–52.0)
Hemoglobin: 13.7 g/dL (ref 13.0–17.0)
MCH: 28.8 pg (ref 26.0–34.0)
MCHC: 32.5 g/dL (ref 30.0–36.0)
MCV: 88.7 fL (ref 80.0–100.0)
Platelets: 195 10*3/uL (ref 150–400)
RBC: 4.76 MIL/uL (ref 4.22–5.81)
RDW: 13.8 % (ref 11.5–15.5)
WBC: 9.7 10*3/uL (ref 4.0–10.5)
nRBC: 0 % (ref 0.0–0.2)

## 2022-12-08 NOTE — Plan of Care (Signed)
  Problem: Pain Managment: Goal: General experience of comfort will improve Outcome: Progressing   Problem: Safety: Goal: Ability to remain free from injury will improve Outcome: Progressing   Problem: Health Behavior/Discharge Planning: Goal: Ability to identify and utilize available resources and services will improve Outcome: Progressing Goal: Ability to manage health-related needs will improve Outcome: Progressing   Problem: Metabolic: Goal: Ability to maintain appropriate glucose levels will improve Outcome: Progressing   Problem: Nutritional: Goal: Maintenance of adequate nutrition will improve Outcome: Progressing Goal: Progress toward achieving an optimal weight will improve Outcome: Progressing   Problem: Skin Integrity: Goal: Risk for impaired skin integrity will decrease Outcome: Progressing   Problem: Tissue Perfusion: Goal: Adequacy of tissue perfusion will improve Outcome: Not Applicable

## 2022-12-08 NOTE — Plan of Care (Signed)

## 2022-12-08 NOTE — Progress Notes (Signed)
    2 Days Post-Op  Subjective: Tolerating some clears but did have some bloating/belching. + flatus. Denies nausea.   Drain amylase not drawn.   Objective: Vital signs in last 24 hours: Temp:  [98.2 F (36.8 C)-99 F (37.2 C)] 98.6 F (37 C) (10/20 0515) Pulse Rate:  [75-96] 96 (10/20 0515) Resp:  [16-18] 16 (10/20 0515) BP: (104-134)/(67-88) 134/88 (10/20 0515) SpO2:  [91 %-95 %] 91 % (10/20 0515)    Intake/Output from previous day: 10/19 0701 - 10/20 0700 In: -  Out: 570 [Urine:550; Drains:20] Intake/Output this shift: No intake/output data recorded.  PE: General: resting comfortably, NAD Neuro: alert and oriented, no focal deficits Resp: normal work of breathing on room air Abdomen: soft, mildly distended, mild ecchymosis at umbilical incision, no erythema or induration. LUQ JP serosanguinous. Extremities: warm and well-perfused GU: foley draining clear yellow urine   Lab Results:  Recent Labs    12/07/22 0249  WBC 8.7  HGB 13.8  HCT 42.0  PLT 211   BMET Recent Labs    12/07/22 0249  NA 141  K 3.7  CL 102  CO2 23  GLUCOSE 124*  BUN 7*  CREATININE 0.96  CALCIUM 9.5   PT/INR No results for input(s): "LABPROT", "INR" in the last 72 hours. 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 - Will keep on clears  this morning.  He is somewhat distended and has been belching.  If he develops nausea/emesis then will consider KUB/NGT.   - Multimodal pain control - Ambulate - Drain amylase today and POD3 - VTE: lovenox, SCDs  - Dispo: inpatient, med-surg floor    LOS: 2 days    Melody Haver, MD [facility] Surgery General Surgery 12/08/22 7:25 AM

## 2022-12-09 LAB — BASIC METABOLIC PANEL
Anion gap: 9 (ref 5–15)
BUN: 13 mg/dL (ref 8–23)
CO2: 25 mmol/L (ref 22–32)
Calcium: 7.9 mg/dL — ABNORMAL LOW (ref 8.9–10.3)
Chloride: 102 mmol/L (ref 98–111)
Creatinine, Ser: 0.93 mg/dL (ref 0.61–1.24)
GFR, Estimated: >60 mL/min (ref >60)
Glucose, Bld: 100 mg/dL — ABNORMAL HIGH (ref 70–99)
Potassium: 3.8 mmol/L (ref 3.5–5.1)
Sodium: 136 mmol/L (ref 135–145)

## 2022-12-09 LAB — GLUCOSE, CAPILLARY
Glucose-Capillary: 104 mg/dL — ABNORMAL HIGH (ref 70–99)
Glucose-Capillary: 112 mg/dL — ABNORMAL HIGH (ref 70–99)
Glucose-Capillary: 121 mg/dL — ABNORMAL HIGH (ref 70–99)
Glucose-Capillary: 126 mg/dL — ABNORMAL HIGH (ref 70–99)
Glucose-Capillary: 129 mg/dL — ABNORMAL HIGH (ref 70–99)
Glucose-Capillary: 95 mg/dL (ref 70–99)
Glucose-Capillary: 96 mg/dL (ref 70–99)

## 2022-12-09 LAB — CBC
HCT: 42.6 % (ref 39.0–52.0)
Hemoglobin: 14.2 g/dL (ref 13.0–17.0)
MCH: 29.8 pg (ref 26.0–34.0)
MCHC: 33.3 g/dL (ref 30.0–36.0)
MCV: 89.3 fL (ref 80.0–100.0)
Platelets: 203 10*3/uL (ref 150–400)
RBC: 4.77 MIL/uL (ref 4.22–5.81)
RDW: 13.4 % (ref 11.5–15.5)
WBC: 8.7 10*3/uL (ref 4.0–10.5)
nRBC: 0 % (ref 0.0–0.2)

## 2022-12-09 MED ORDER — ENSURE ENLIVE PO LIQD
237.0000 mL | Freq: Two times a day (BID) | ORAL | Status: DC
Start: 2022-12-09 — End: 2022-12-10
  Administered 2022-12-09 – 2022-12-10 (×3): 237 mL via ORAL

## 2022-12-09 NOTE — Care Management Important Message (Signed)
Important Message  Patient Details  Name: Angel Lawson MRN: [id number] Date of Birth: 04-Jun-1955   Important Message Given:  Yes - Medicare IM     Sherilyn Banker 12/09/2022, 4:23 PM

## 2022-12-09 NOTE — Progress Notes (Signed)
    3 Days Post-Op  Subjective: NAEO. Reports mild nausea this morning, which he attributes to hunger. States he drank roughly 2 juice cups yesterday at lunch time and then could not drink for the rest of the day. Endorses flatus. State she feels less bloated today. No reported urinary sxs.   Objective: Vital signs in last 24 hours: Temp:  [98.2 F (36.8 C)-98.6 F (37 C)] 98.4 F (36.9 C) (10/21 0820) Pulse Rate:  [91-94] 93 (10/21 0820) Resp:  [17-19] 17 (10/21 0820) BP: (128-139)/(74-89) 128/89 (10/21 0820) SpO2:  [91 %-95 %] 94 % (10/21 0820)    Intake/Output from previous day: 10/20 0701 - 10/21 0700 In: 720 [P.O.:720] Out: 40 [Drains:40] Intake/Output this shift: No intake/output data recorded.  PE: General: resting comfortably, NAD Neuro: alert and oriented, no focal deficits Resp: normal work of breathing on room air Abdomen: soft, mildly distended upper abdomen. mild ecchymosis at umbilical incision, no erythema or induration. LUQ JP SS . Extremities: warm and well-perfused   Lab Results:  Recent Labs    12/07/22 0249 12/08/22 1046  WBC 8.7 9.7  HGB 13.8 13.7  HCT 42.0 42.2  PLT 211 195   BMET Recent Labs    12/07/22 0249 12/08/22 1046  NA 141 137  K 3.7 3.8  CL 102 100  CO2 23 26  GLUCOSE 124* 108*  BUN 7* 11  CREATININE 0.96 0.94  CALCIUM 9.5 8.3*   PT/INR No results for input(s): "LABPROT", "INR" in the last 72 hours. 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 - Will keep on clears this morning. Mild distention but reports less bloating overall and he is having flatus. Plan to advance to FLD this afternoon.  If he develops worsening nausea/emesis then will consider KUB/NGT.   - Multimodal pain control - Ambulate - Drain amylase POD#1 (unsure this was collcted?) and POD3 (today) - I personally reached out to the RN to confirm that this lab gets collected today.  - surgical path pending   - VTE: lovenox, SCDs  - Dispo: inpatient, med-surg floor    LOS: 3 days    Hosie Spangle [facility] Surgery General Surgery 12/09/22 9:39 AM

## 2022-12-10 LAB — GLUCOSE, CAPILLARY
Glucose-Capillary: 116 mg/dL — ABNORMAL HIGH (ref 70–99)
Glucose-Capillary: 158 mg/dL — ABNORMAL HIGH (ref 70–99)

## 2022-12-10 LAB — AMYLASE, BODY FLUID (OTHER): Amylase, Body Fluid: 791 U/L

## 2022-12-10 MED ORDER — METHOCARBAMOL 500 MG PO TABS: 500.0000 mg | ORAL_TABLET | Freq: Four times a day (QID) | ORAL | 0 refills | Status: AC | PRN

## 2022-12-10 MED ORDER — OXYCODONE HCL 5 MG PO TABS: 5.0000 mg | ORAL_TABLET | Freq: Four times a day (QID) | ORAL | 0 refills | Status: AC | PRN

## 2022-12-10 MED ORDER — DOCUSATE SODIUM 100 MG PO CAPS: 100.0000 mg | ORAL_CAPSULE | Freq: Two times a day (BID) | ORAL | 0 refills | Status: AC

## 2022-12-10 MED ORDER — ACETAMINOPHEN 500 MG PO TABS: 1000.0000 mg | ORAL_TABLET | Freq: Three times a day (TID) | ORAL | 0 refills | Status: AC

## 2022-12-10 NOTE — Discharge Summary (Addendum)
[facility] Discharge Summary   Patient ID: Angel Lawson MRN: [id number] DOB/AGE: 1955/06/29 67 y.o.  Admit date: 12/06/2022 Discharge date: 12/10/2022  Admitting Diagnosis: Pancreatic neoplasm Pancreatic tail mass   Discharge Diagnosis Patient Active Problem List   Diagnosis Date Noted   Mucinous neoplasm of pancreas 12/06/2022   Gallstone pancreatitis 07/10/2021   Gallbladder polyp 07/09/2021   Common bile duct calculi - possible 07/09/2021   Abnormal LFTs 07/08/2021   Elevated lipase 07/08/2021   Cystic mass of pancreas 04/25/2021   Biceps tendon tear 12/02/2019   Arthritis of right knee 04/26/2015   Left lateral epicondylitis 04/26/2015   Hypercholesteremia 04/25/2015   Generalized anxiety disorder 04/25/2015   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  Procedures Dr. Sophronia Simas (12/06/22) -  Laparoscopic distal pancreatectomy with splenic vessel preservation; intraoperative ultrasound   HPI: Angel Lawson is a 67 yo male with a mucinous cystic neoplasm in the tail of the pancreas. This has been followed and demonstrated slow enlargement, after which he was referred for an EUS. This was done in April of this year, and fluid analysis was consistent with an MCN. He presents today for surgical resection. He has had no clinical changes since has last office visit with me on 9/17. He recently had a follow up CT scan that showed a stable septated cyst in the tail of the pancreas.     Hospital Course:  The patient was admitted and underwent the above operation by Dr. Freida Lawson where a lobulated cystic mass was found in the body/tail of the pancreas.There were no overt signs of malignancy. The spleen and splenic vessels were preserved. The patient tolerated the procedure well and was transferred to the floor in stable condition. His diet was slowly advanced as tolerated. Drain amylase on 10/21 (POD#3) was 791. On 10/22 the  patients pain was controlled, tolerating PO, having bowel function, mobilizing and felt stable for discharge home. On the day of discharge his final surgical pathology was still pending.  Physical Exam: General:  Alert, NAD, pleasant, comfortable Abd:  Soft, ND, overall non-tender, mild ecchymosis around umbilical incision, JP with SS drainage,  15 mL  Allergies as of 12/10/2022   No Known Allergies      Medication List     TAKE these medications    acetaminophen 500 MG tablet Commonly known as: TYLENOL Take 2 tablets (1,000 mg total) by mouth 3 (three) times daily.   ALPRAZolam 0.5 MG tablet Commonly known as: XANAX Take 0.5 mg by mouth 3 (three) times daily as needed for anxiety or sleep.   buPROPion 300 MG 24 hr tablet Commonly known as: WELLBUTRIN XL Take 300 mg by mouth every morning.   docusate sodium 100 MG capsule Commonly known as: COLACE Take 1 capsule (100 mg total) by mouth 2 (two) times daily.   fluticasone 50 MCG/ACT nasal spray Commonly known as: FLONASE Place 1 spray into both nostrils at bedtime as needed (seasonal allergies).   ibuprofen 200 MG tablet Commonly known as: ADVIL Take 400 mg by mouth daily as needed for headache or moderate pain (pain).   methocarbamol 500 MG tablet Commonly known as: ROBAXIN Take 1 tablet (500 mg total) by mouth every 6 (six) hours as needed for muscle spasms (abdominal pain, post-op).   multivitamin with minerals Tabs tablet Take 1 tablet by mouth in the morning.   oxyCODONE 5 MG immediate release tablet Commonly known as: Oxy IR/ROXICODONE Take 1-2  tablets (5-10 mg total) by mouth every 6 (six) hours as needed (5mg  for moderate pain, 10mg  for severe pain).   rosuvastatin 20 MG tablet Commonly known as: CRESTOR Take 20 mg by mouth at bedtime.   tadalafil 5 MG tablet Commonly known as: CIALIS Take 5 mg by mouth every morning.   testosterone cypionate 200 MG/ML injection Commonly known as: DEPOTESTOSTERONE  CYPIONATE Inject 100 mg into the muscle every 21 ( twenty-one) days. Administered by Dr. Antony Haste          Follow-up Information     Fritzi Mandes, MD. Go on 01/01/2023.   Specialty: General Surgery Why: at 9:20 AM for post-operative follow up. please arrive 15 minutes early. Contact information: [address] [place] [address] [phone]                 Signed: Hosie Spangle, [facility] Surgery 12/10/2022, 1:55 PM

## 2022-12-11 LAB — SURGICAL PATHOLOGY

## 2022-12-12 LAB — BPAM RBC
Blood Product Expiration Date: 202411092359
Blood Product Expiration Date: 202411092359
Blood Product Expiration Date: 202411092359
Blood Product Expiration Date: 202411142359
ISSUE DATE / TIME: 202410180920
ISSUE DATE / TIME: 202410180920
ISSUE DATE / TIME: 202410200312
ISSUE DATE / TIME: 202410200750
Unit Type and Rh: 6200
Unit Type and Rh: 6200
Unit Type and Rh: 6200
Unit Type and Rh: 6200

## 2022-12-12 LAB — TYPE AND SCREEN
ABO/RH(D): A POS
Antibody Screen: NEGATIVE
Unit division: 0
Unit division: 0
Unit division: 0
Unit division: 0

## 2023-06-16 ENCOUNTER — Other Ambulatory Visit: Payer: Self-pay | Admitting: Surgery

## 2023-06-16 DIAGNOSIS — D490 Neoplasm of unspecified behavior of digestive system: Secondary | ICD-10-CM

## 2023-06-20 ENCOUNTER — Other Ambulatory Visit

## 2023-06-27 ENCOUNTER — Ambulatory Visit
Admission: RE | Admit: 2023-06-27 | Discharge: 2023-06-27 | Disposition: A | Discharge status: Home / Self Care | Source: Ambulatory Visit | Attending: Surgery

## 2023-06-27 DIAGNOSIS — D490 Neoplasm of unspecified behavior of digestive system: Secondary | ICD-10-CM

## 2023-06-27 MED ORDER — IOPAMIDOL (ISOVUE-300) INJECTION 61%
100.0000 mL | Freq: Once | INTRAVENOUS | Status: AC | PRN
  Administered 2023-06-27: 100 mL via INTRAVENOUS
